# Patient Record
Sex: Male | Born: 1955
Health system: Southern US, Community
[De-identification: ages and names within clinical notes are randomized; demographics above are authoritative.]

## PROBLEM LIST (undated history)

## (undated) DIAGNOSIS — Z860100 Personal history of colon polyps, unspecified: Secondary | ICD-10-CM

## (undated) DIAGNOSIS — E785 Hyperlipidemia, unspecified: Secondary | ICD-10-CM

## (undated) DIAGNOSIS — T7840XA Allergy, unspecified, initial encounter: Secondary | ICD-10-CM

## (undated) HISTORY — DX: Hyperlipidemia, unspecified: E78.5

## (undated) HISTORY — DX: Personal history of colonic polyps: Z86.010

## (undated) HISTORY — DX: Allergy, unspecified, initial encounter: T78.40XA

## (undated) HISTORY — DX: Personal history of colon polyps, unspecified: Z86.0100

## (undated) HISTORY — PX: OTHER SURGICAL HISTORY: SHX169

---

## 2004-02-12 HISTORY — PX: COLONOSCOPY: SHX174

## 2004-11-09 ENCOUNTER — Ambulatory Visit (HOSPITAL_COMMUNITY): Admission: RE | Admit: 2004-11-09 | Discharge: 2004-11-09 | Payer: Self-pay | Admitting: Gastroenterology

## 2004-11-09 ENCOUNTER — Encounter (INDEPENDENT_AMBULATORY_CARE_PROVIDER_SITE_OTHER): Payer: Self-pay | Admitting: Specialist

## 2004-11-09 LAB — HM COLONOSCOPY

## 2005-08-12 ENCOUNTER — Ambulatory Visit: Payer: Self-pay | Admitting: Family Medicine

## 2005-11-18 ENCOUNTER — Ambulatory Visit: Payer: Self-pay | Admitting: Family Medicine

## 2006-11-28 ENCOUNTER — Ambulatory Visit: Payer: Self-pay | Admitting: Family Medicine

## 2007-12-02 ENCOUNTER — Ambulatory Visit: Payer: Self-pay | Admitting: Family Medicine

## 2007-12-08 ENCOUNTER — Ambulatory Visit: Payer: Self-pay | Admitting: Family Medicine

## 2008-02-09 ENCOUNTER — Ambulatory Visit: Payer: Self-pay | Admitting: Family Medicine

## 2008-06-10 ENCOUNTER — Ambulatory Visit: Payer: Self-pay | Admitting: Family Medicine

## 2008-12-23 ENCOUNTER — Ambulatory Visit: Payer: Self-pay | Admitting: Family Medicine

## 2009-03-28 ENCOUNTER — Ambulatory Visit: Payer: Self-pay | Admitting: Family Medicine

## 2009-07-08 ENCOUNTER — Emergency Department: Payer: Self-pay | Admitting: Internal Medicine

## 2009-07-14 ENCOUNTER — Ambulatory Visit: Payer: Self-pay | Admitting: Family Medicine

## 2010-01-01 ENCOUNTER — Ambulatory Visit: Payer: Self-pay | Admitting: Family Medicine

## 2010-08-23 IMAGING — CT CT HEAD WITHOUT CONTRAST
2 series · 16 of 30 positions shown, 20 images · non-contrast
Comparison: none

REASON FOR EXAM: facial numbness on r
COMMENTS:

[Series 2: without · axial · non-contrast · 0.46mm/px · z∈[-191,-51]mm · 13 of 34 slices shown, 17 images]
[im 3/34  brain]
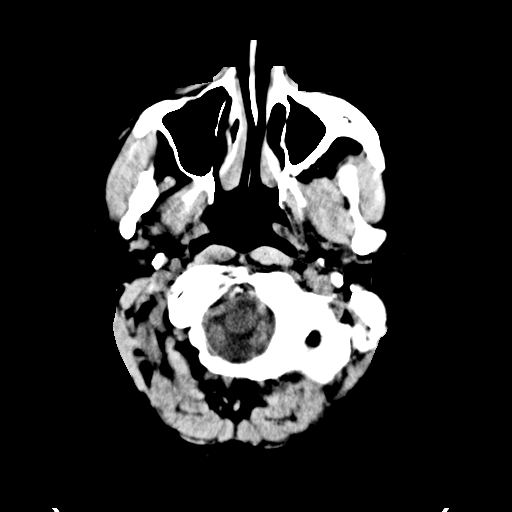
[im 3/34  bone]
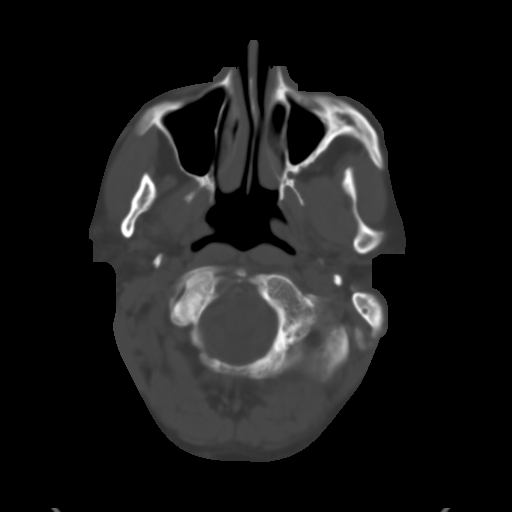
[im 5/34  brain]
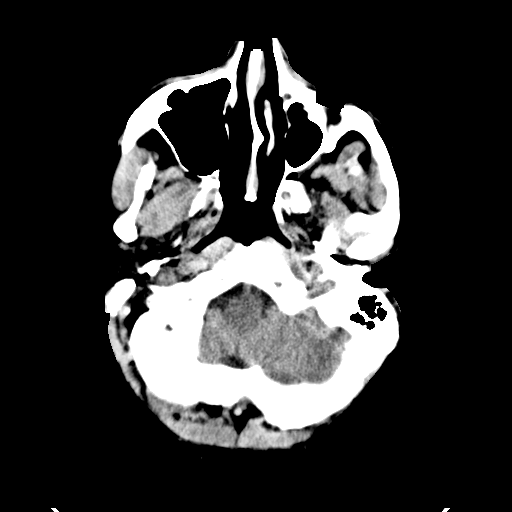
[im 8/34  brain]
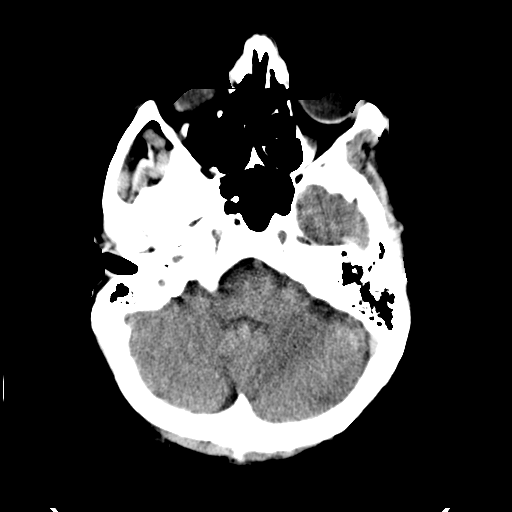
[im 10/34  brain]
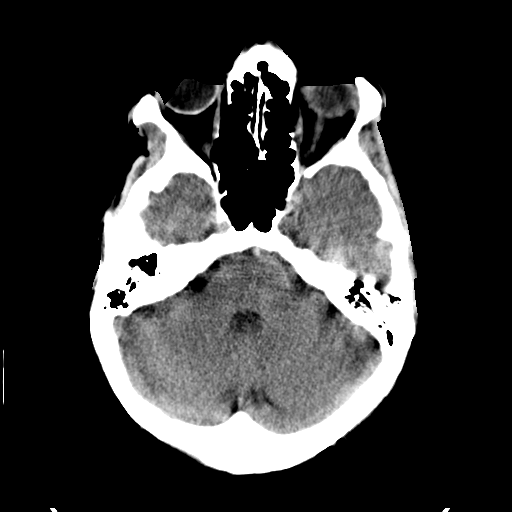
[im 12/34  brain]
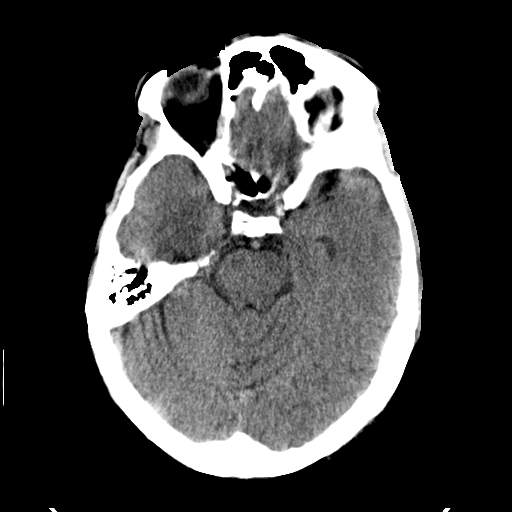
[im 12/34  bone]
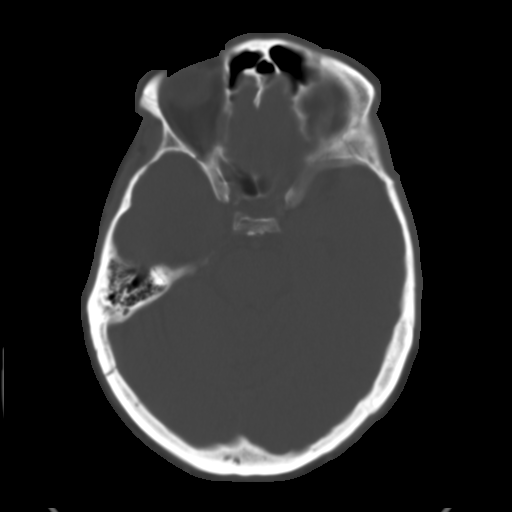
[im 15/34  brain]
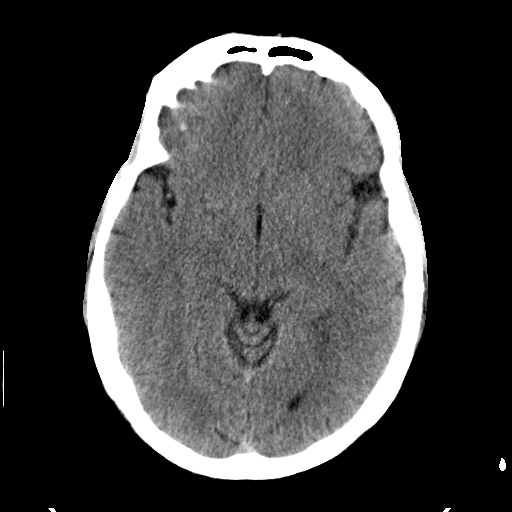
[im 17/34  brain]
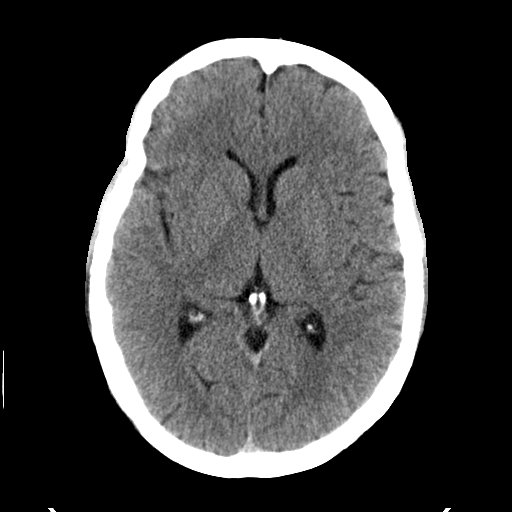
[im 19/34  brain]
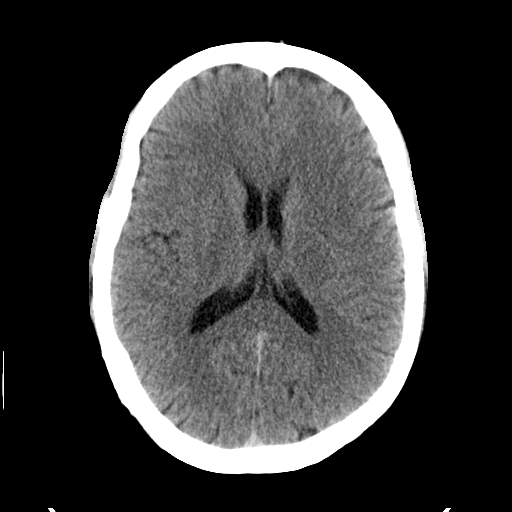
[im 22/34  brain]
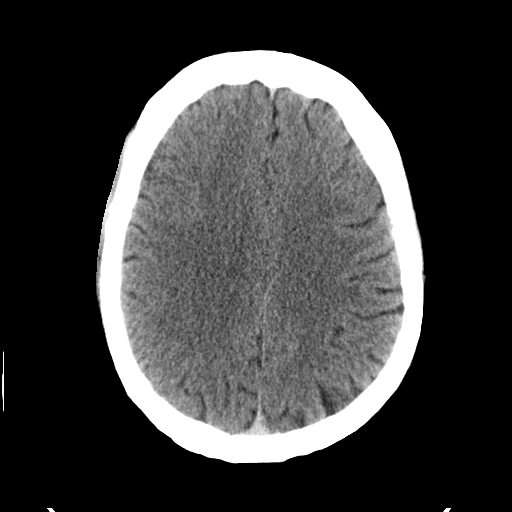
[im 22/34  bone]
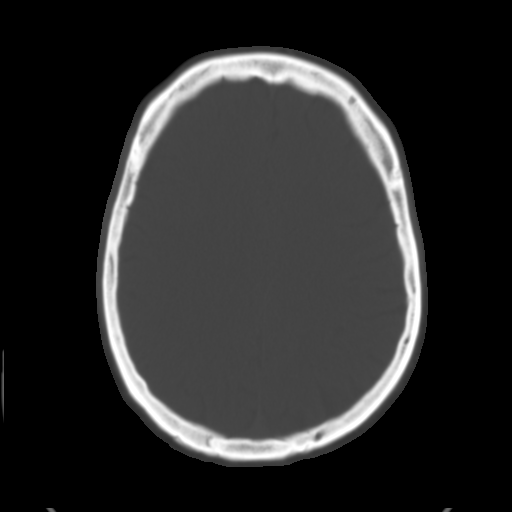
[im 24/34  brain]
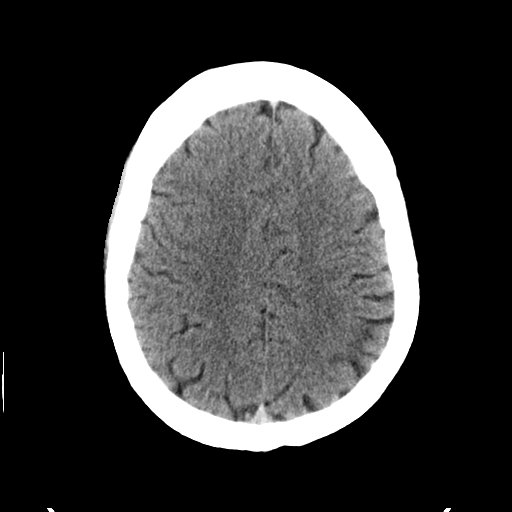
[im 26/34  brain]
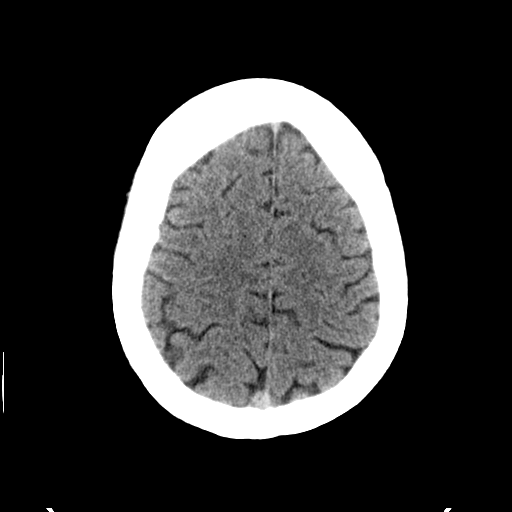
[im 29/34  brain]
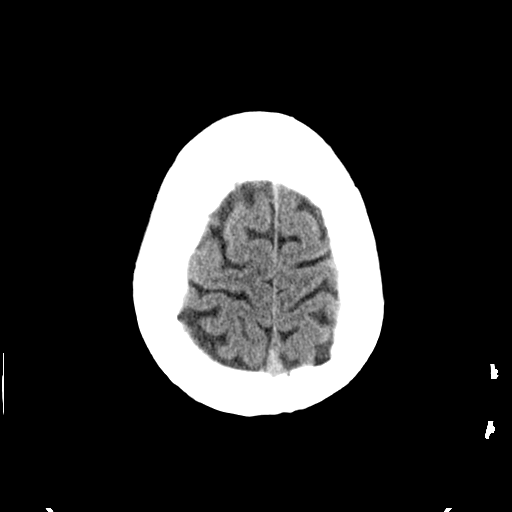
[im 31/34  brain]
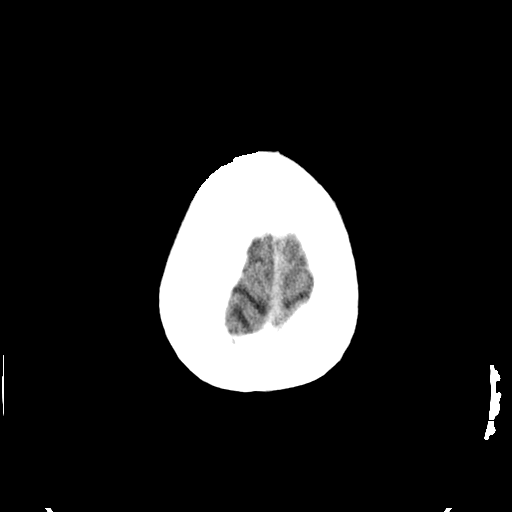
[im 31/34  bone]
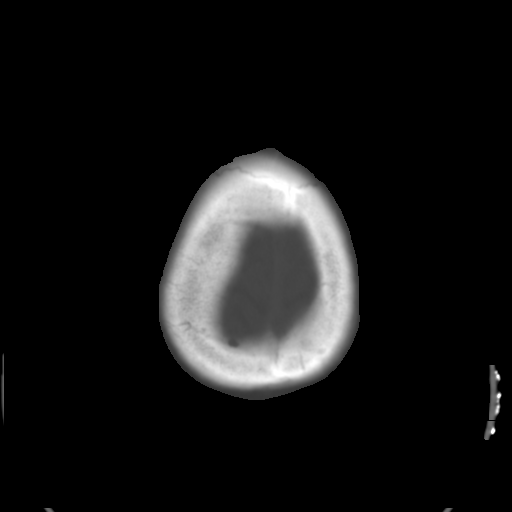

[Series 3: bone · axial · 0.46mm/px · z∈[-191,-146]mm · 3 of 34 slices shown]
[im 3/34  bone]
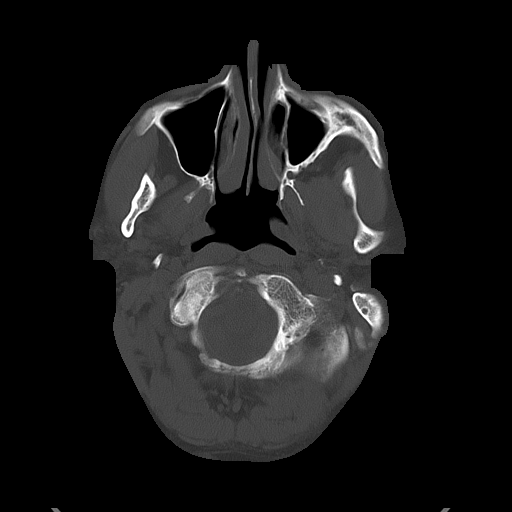
[im 8/34  bone]
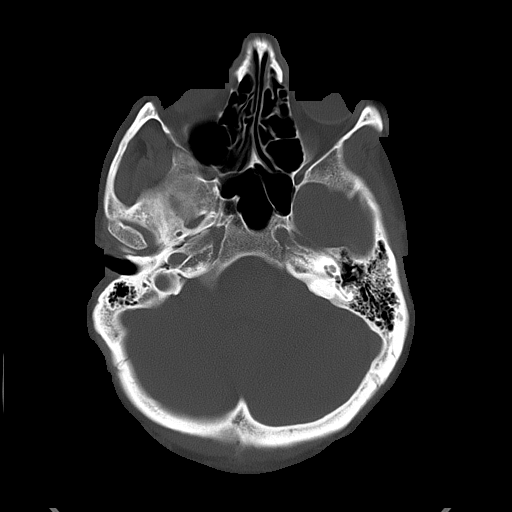
[im 12/34  bone]
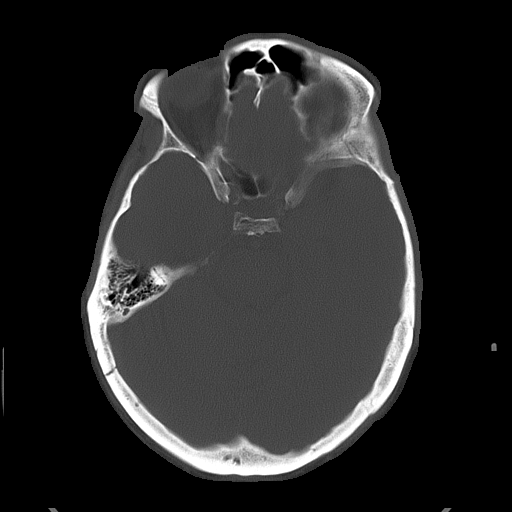

[16 of 30 positions shown; findings below may reference images not displayed]

PROCEDURE:     CT  - CT HEAD WITHOUT CONTRAST  - July 08, 2009  [DATE]

RESULT:     Noncontrast emergent CT of the brain is performed in the
standard fashion. The patient has no previous exam for comparison.

The ventricles and sulci are normal. There is no hemorrhage. There is no
focal mass, mass-effect or midline shift. There is no evidence of edema or
territorial infarct. The bone windows demonstrate normal aeration of the
paranasal sinuses and mastoid air cells. There is no skull fracture
demonstrated.
IMPRESSION: 1. No acute intracranial abnormality.

## 2011-01-07 ENCOUNTER — Encounter: Payer: Self-pay | Admitting: Family Medicine

## 2011-01-08 ENCOUNTER — Ambulatory Visit (INDEPENDENT_AMBULATORY_CARE_PROVIDER_SITE_OTHER): Payer: BC Managed Care – PPO | Admitting: Family Medicine

## 2011-01-08 ENCOUNTER — Encounter: Payer: Self-pay | Admitting: Family Medicine

## 2011-01-08 VITALS — BP 124/80 | HR 60 | Wt 204.0 lb

## 2011-01-08 DIAGNOSIS — J301 Allergic rhinitis due to pollen: Secondary | ICD-10-CM | POA: Insufficient documentation

## 2011-01-08 DIAGNOSIS — Z6379 Other stressful life events affecting family and household: Secondary | ICD-10-CM

## 2011-01-08 DIAGNOSIS — Z0000 Encounter for general adult medical examination without abnormal findings: Secondary | ICD-10-CM

## 2011-01-08 DIAGNOSIS — Z209 Contact with and (suspected) exposure to unspecified communicable disease: Secondary | ICD-10-CM

## 2011-01-08 DIAGNOSIS — E785 Hyperlipidemia, unspecified: Secondary | ICD-10-CM | POA: Insufficient documentation

## 2011-01-08 DIAGNOSIS — Z23 Encounter for immunization: Secondary | ICD-10-CM

## 2011-01-08 LAB — POCT URINALYSIS DIPSTICK
Bilirubin, UA: NEGATIVE
Blood, UA: NEGATIVE
Glucose, UA: NEGATIVE
Ketones, UA: NEGATIVE
Leukocytes, UA: NEGATIVE
Nitrite, UA: NEGATIVE
Protein, UA: NEGATIVE
Spec Grav, UA: 1.02
Urobilinogen, UA: NEGATIVE
pH, UA: 5

## 2011-01-08 LAB — LIPID PANEL
Cholesterol: 224 mg/dL — ABNORMAL HIGH (ref 0–200)
HDL: 35 mg/dL — ABNORMAL LOW (ref >39)
LDL Cholesterol: 160 mg/dL — ABNORMAL HIGH (ref 0–99)
Total CHOL/HDL Ratio: 6.4 Ratio
Triglycerides: 147 mg/dL (ref <150)
VLDL: 29 mg/dL (ref 0–40)

## 2011-01-08 LAB — CBC WITH DIFFERENTIAL/PLATELET
Basophils Absolute: 0.1 10*3/uL (ref 0.0–0.1)
Basophils Relative: 1 % (ref 0–1)
Eosinophils Absolute: 0 10*3/uL (ref 0.0–0.7)
Eosinophils Relative: 1 % (ref 0–5)
HCT: 45.4 % (ref 39.0–52.0)
Hemoglobin: 15.4 g/dL (ref 13.0–17.0)
Lymphocytes Relative: 23 % (ref 12–46)
Lymphs Abs: 1.9 10*3/uL (ref 0.7–4.0)
MCH: 30.3 pg (ref 26.0–34.0)
MCHC: 33.9 g/dL (ref 30.0–36.0)
MCV: 89.2 fL (ref 78.0–100.0)
Monocytes Absolute: 0.5 10*3/uL (ref 0.1–1.0)
Monocytes Relative: 6 % (ref 3–12)
Neutro Abs: 5.9 10*3/uL (ref 1.7–7.7)
Neutrophils Relative %: 70 % (ref 43–77)
Platelets: 256 10*3/uL (ref 150–400)
RBC: 5.09 MIL/uL (ref 4.22–5.81)
RDW: 13.1 % (ref 11.5–15.5)
WBC: 8.4 10*3/uL (ref 4.0–10.5)

## 2011-01-08 LAB — COMPREHENSIVE METABOLIC PANEL
ALT: 31 U/L (ref 0–53)
AST: 28 U/L (ref 0–37)
Albumin: 4.8 g/dL (ref 3.5–5.2)
Alkaline Phosphatase: 70 U/L (ref 39–117)
BUN: 24 mg/dL — ABNORMAL HIGH (ref 6–23)
CO2: 25 mEq/L (ref 19–32)
Calcium: 9.8 mg/dL (ref 8.4–10.5)
Chloride: 104 mEq/L (ref 96–112)
Creat: 1.12 mg/dL (ref 0.50–1.35)
Glucose, Bld: 96 mg/dL (ref 70–99)
Potassium: 4.2 mEq/L (ref 3.5–5.3)
Sodium: 141 mEq/L (ref 135–145)
Total Bilirubin: 1.1 mg/dL (ref 0.3–1.2)
Total Protein: 7.2 g/dL (ref 6.0–8.3)

## 2011-01-08 LAB — POC HEMOCCULT BLD/STL (OFFICE/1-CARD/DIAGNOSTIC): Fecal Occult Blood, POC: NEGATIVE

## 2011-01-08 MED ORDER — ALPRAZOLAM 0.25 MG PO TABS: 0.2500 mg | ORAL_TABLET | Freq: Three times a day (TID) | ORAL | Status: DC | PRN

## 2011-01-08 NOTE — Progress Notes (Signed)
Subjective:    Patient ID: Curtis Walker, male    DOB: 03-22-1955, 55 y.o.   MRN: [id number]  HPI He is here for complete examination. His allergies seem to be under good control. He does have a history of hyperlipidemia but presently is on no medications. He quit smoking in 1987. He has been under stress recently due to to illness with his father. His father is intubated and there is concerns over when to extubate him. He is sexually active and would like to have some STD screening.   Review of Systems  Constitutional: Negative.   HENT: Negative.   Eyes: Negative.   Respiratory: Negative.   Cardiovascular: Negative.   Gastrointestinal: Negative.   Genitourinary: Negative.   Musculoskeletal: Negative.   Skin: Negative.   Neurological: Negative.   Hematological: Negative.   Psychiatric/Behavioral: Negative.        Objective:   Physical Exam BP 124/80  Pulse 60  Wt 204 lb (92.534 kg)  General Appearance:    Alert, cooperative, no distress, appears stated age  Head:    Normocephalic, without obvious abnormality, atraumatic  Eyes:    PERRL, conjunctiva/corneas clear, EOM's intact, fundi    benign  Ears:    Normal TM's and external ear canals  Nose:   Nares normal, mucosa normal, no drainage or sinus   tenderness  Throat:   Lips, mucosa, and tongue normal; teeth and gums normal  Neck:   Supple, no lymphadenopathy;  thyroid:  no   enlargement/tenderness/nodules; no carotid   bruit or JVD  Back:    Spine nontender, no curvature, ROM normal, no CVA     tenderness  Lungs:     Clear to auscultation bilaterally without wheezes, rales or     ronchi; respirations unlabored  Chest Wall:    No tenderness or deformity   Heart:    Regular rate and rhythm, S1 and S2 normal, no murmur, rub   or gallop  Breast Exam:    No chest wall tenderness, masses or gynecomastia  Abdomen:     Soft, non-tender, nondistended, normoactive bowel sounds,    no masses, no hepatosplenomegaly  Genitalia:     Normal male external genitalia without lesions.  Testicles without masses.  No inguinal hernias.  Rectal:    Normal sphincter tone, no masses or tenderness; guaiac negative stool.  Prostate smooth, no nodules, not enlarged.  Extremities:   No clubbing, cyanosis or edema  Pulses:   2+ and symmetric all extremities  Skin:   Skin color, texture, turgor normal, no rashes or lesions  Lymph nodes:   Cervical, supraclavicular, and axillary nodes normal  Neurologic:   CNII-XII intact, normal strength, sensation and gait; reflexes 2+ and symmetric throughout          Psych:   Normal mood, affect, hygiene and grooming.           Assessment & Plan:   1. Physical exam, annual  POCT Urinalysis Dipstick, POCT Hemoccult (POC) Blood/Stool Test, CBC with Differential, Comprehensive metabolic panel, Lipid panel  2. Contact with or exposure to unspecified communicable disease  HIV Antibody ( Reflex), RPR  3. Stressful life event affecting family    4. Allergic rhinitis due to pollen    5. Hyperlipidemia LDL goal <100     we discussed stress and stress management with him especially in regard to his father and extubating him. He will discuss this with his stepmother. Xanax prescription was written. His immunizations were also updated.

## 2011-01-08 NOTE — Patient Instructions (Signed)
Uses Xanax as needed for anxiety in dealing with your father

## 2011-01-09 LAB — RPR

## 2011-01-09 LAB — HIV ANTIBODY (ROUTINE TESTING W REFLEX): HIV: NONREACTIVE

## 2011-05-27 ENCOUNTER — Ambulatory Visit (INDEPENDENT_AMBULATORY_CARE_PROVIDER_SITE_OTHER): Payer: BC Managed Care – PPO | Admitting: Family Medicine

## 2011-05-27 ENCOUNTER — Encounter: Payer: Self-pay | Admitting: Family Medicine

## 2011-05-27 VITALS — BP 110/60 | HR 78 | Temp 98.1°F | Resp 16 | Wt 206.0 lb

## 2011-05-27 DIAGNOSIS — K648 Other hemorrhoids: Secondary | ICD-10-CM

## 2011-05-27 MED ORDER — HYDROCORTISONE ACE-PRAMOXINE 1-1 % RE FOAM: 1.0000 | Freq: Two times a day (BID) | RECTAL | Status: AC

## 2011-05-27 NOTE — Patient Instructions (Signed)
Medicine for about a week and also assure you have softer bowel movements. If you have more difficulty call me

## 2011-05-27 NOTE — Progress Notes (Signed)
  Subjective:    Patient ID: Curtis Walker, male    DOB: Aug 08, 1955, 56 y.o.   MRN: [id number]  HPI He notes a three-week history of intermittent difficulty with rectal bleeding. This is especially to when he has to strain to have a BM. He is normally fairly regular. He has no pain with this. He does note tissue protruding from the anus.   Review of Systems     Objective:   Physical Exam Anal exam shows no external hemorrhoids or redundant tissue. Digital rectal exam showed no palpable lesions.       Assessment & Plan:   1. Internal hemorrhoids  hydrocortisone-pramoxine (PROCTOFOAM HC) rectal foam   Also recommend he continue to have softer BMs. If continued difficulty, he will call me for probable referral for further therapy for the internal hemorrhoids.

## 2011-10-16 ENCOUNTER — Encounter: Payer: Self-pay | Admitting: Medical

## 2011-10-16 ENCOUNTER — Ambulatory Visit (INDEPENDENT_AMBULATORY_CARE_PROVIDER_SITE_OTHER): Payer: BC Managed Care – PPO | Admitting: Medical

## 2011-10-16 VITALS — BP 110/70 | HR 78 | Temp 98.3°F | Resp 16 | Wt 200.0 lb

## 2011-10-16 DIAGNOSIS — A088 Other specified intestinal infections: Secondary | ICD-10-CM

## 2011-10-16 DIAGNOSIS — A084 Viral intestinal infection, unspecified: Secondary | ICD-10-CM

## 2011-10-16 DIAGNOSIS — R197 Diarrhea, unspecified: Secondary | ICD-10-CM

## 2011-10-16 NOTE — Patient Instructions (Signed)
Viral Gastroenteritis Viral gastroenteritis is also known as stomach flu. This condition affects the stomach and intestinal tract. It can cause sudden diarrhea and vomiting. The illness typically lasts 3 to 8 days. Most people develop an immune response that eventually gets rid of the virus. While this natural response develops, the virus can make you quite ill. CAUSES  Many different viruses can cause gastroenteritis, such as rotavirus or noroviruses. You can catch one of these viruses by consuming contaminated food or water. You may also catch a virus by sharing utensils or other personal items with an infected person or by touching a contaminated surface. SYMPTOMS  The most common symptoms are diarrhea and vomiting. These problems can cause a severe loss of body fluids (dehydration) and a body salt (electrolyte) imbalance. Other symptoms may include:  Fever.   Headache.   Fatigue.   Abdominal pain.  DIAGNOSIS  Your caregiver can usually diagnose viral gastroenteritis based on your symptoms and a physical exam. A stool sample may also be taken to test for the presence of viruses or other infections. TREATMENT  This illness typically goes away on its own. Treatments are aimed at rehydration. The most serious cases of viral gastroenteritis involve vomiting so severely that you are not able to keep fluids down. In these cases, fluids must be given through an intravenous line (IV). HOME CARE INSTRUCTIONS   Drink enough fluids to keep your urine clear or pale yellow. Drink small amounts of fluids frequently and increase the amounts as tolerated.   Ask your caregiver for specific rehydration instructions.   Avoid:   Foods high in sugar.   Alcohol.   Carbonated drinks.   Tobacco.   Juice.   Caffeine drinks.   Extremely hot or cold fluids.   Fatty, greasy foods.   Too much intake of anything at one time.   Dairy products until 24 to 48 hours after diarrhea stops.   You may  consume probiotics. Probiotics are active cultures of beneficial bacteria. They may lessen the amount and number of diarrheal stools in adults. Probiotics can be found in yogurt with active cultures and in supplements.   Wash your hands well to avoid spreading the virus.   Only take over-the-counter or prescription medicines for pain, discomfort, or fever as directed by your caregiver. Do not give aspirin to children. Antidiarrheal medicines are not recommended.   Ask your caregiver if you should continue to take your regular prescribed and over-the-counter medicines.   Keep all follow-up appointments as directed by your caregiver.  SEEK IMMEDIATE MEDICAL CARE IF:   You are unable to keep fluids down.   You do not urinate at least once every 6 to 8 hours.   You develop shortness of breath.   You notice blood in your stool or vomit. This may look like coffee grounds.   You have abdominal pain that increases or is concentrated in one small area (localized).   You have persistent vomiting or diarrhea.   You have a fever.   The patient is a child younger than 3 months, and he or she has a fever.   The patient is a child older than 3 months, and he or she has a fever and persistent symptoms.   The patient is a child older than 3 months, and he or she has a fever and symptoms suddenly get worse.   The patient is a baby, and he or she has no tears when crying.  MAKE SURE YOU:     Understand these instructions.   Will watch your condition.   Will get help right away if you are not doing well or get worse.  Document Released: 01/28/2005 Document Revised: 01/17/2011 Document Reviewed: 11/14/2010 [employer] Patient Information 2012 [employer]. 

## 2011-10-16 NOTE — Progress Notes (Signed)
Subjective:   HPI  Curtis Walker is a 56 y.o. male who presents for diarrhea since Sunday.   Been having multiple episodes of diarrhea since Sunday 4 days ago.  Had 1-2 episodes of diarrhea Sunday, but the last few days at least 10 episodes of diarrhea.  Very watery diarrhea.  Diarrhea has not been bloody, but he does have hx/o internal hemorrhoid that bleeds intermittent, and it did bleed yesterday.   Not very foul odor compared to normal stool.  He traveled to [place] the week prior, but no recent international travel.  He denies eating undercooked meat or meat left out all day.  He did visit mother in skilled nursing facility over the weekend.   Denies any animal contact other than his dog that sleeps with him.   Feels a little flushed, subjective fever.  Denies nausea, vomiting.  Denies abdominal pain, but is sore from recent abdominal exercises.   No sick contacts with similar.  No recent antibiotic use.  Using Imodium  OTC 4 daily.  Eating yogurt, drinking lots of clear fluids and buttermilk.  No other aggravating or relieving factors.    No other c/o.  The following portions of the patient's history were reviewed and updated as appropriate: allergies, current medications, past family history, past medical history, past social history, past surgical history and problem list.  Past Medical History  Diagnosis Date  . Allergy     RHINITIS  . Dyslipidemia   . History of colonic polyps   . Substance abuse     FORMER SMOKER    Allergies  Allergen Reactions  . Codeine     Review of Systems ROS reviewed and was negative other than noted in HPI or above.    Objective:   Physical Exam  General appearance: alert, no distress, WD/WN Sclera anicteric, no pallor Oral cavity: MMM, no lesions Neck: supple, no lymphadenopathy, no thyromegaly, no masses Heart: RRR, normal S1, S2, no murmurs Lungs: CTA bilaterally, no wheezes, rhonchi, or rales Abdomen: +somehwat increased bs, soft, non  tender, non distended, no masses, no hepatomegaly, no splenomegaly Pulses: 2+ symmetric  Assessment and Plan :     Encounter Diagnoses  Name Primary?  . Viral gastroenteritis Yes  . Diarrhea    Discussed diagnosis, symptoms, treatment.  symptoms suggest viral gastroenteritis.  Advised rest, push clear fluids, BRAT diet, limit Imodium, and if not improving in the next 48 hours, call back.

## 2011-10-18 ENCOUNTER — Telehealth: Payer: Self-pay | Admitting: Medical

## 2011-10-18 NOTE — Telephone Encounter (Signed)
Pt called and said that he has been out of work all week due to the stomach bug he saw you for on Wednesday. He states he would like a out of work note from Tuesday thru the end of the week including today (friday). Please inform if this is ok and front office will handle.

## 2011-10-21 NOTE — Telephone Encounter (Signed)
pls write note for out of work due to viral gastroenteritis

## 2011-10-21 NOTE — Telephone Encounter (Signed)
He had some mild symptoms when I saw him.  What was his symptoms later in the week?  Worse diarrhea, nausea, vomiting?    How is he doing today?

## 2011-10-21 NOTE — Telephone Encounter (Signed)
Patient states he is some what better as far as the diarrhea. He states that the nausa and the vomiting is better. He didn't have any problems over the weekend. He said that he stayed out of work last week from Tuesday until Friday and he would like a work note for those days. CLS

## 2011-10-22 ENCOUNTER — Encounter: Payer: Self-pay | Admitting: Medical

## 2011-10-22 NOTE — Telephone Encounter (Signed)
FAXED OUT OF WORK LETTER TO PT PER HIS REQUEST FAX # [phone]

## 2012-01-17 ENCOUNTER — Ambulatory Visit (INDEPENDENT_AMBULATORY_CARE_PROVIDER_SITE_OTHER): Payer: BC Managed Care – PPO | Admitting: Family Medicine

## 2012-01-17 ENCOUNTER — Encounter: Payer: Self-pay | Admitting: Family Medicine

## 2012-01-17 VITALS — BP 120/76 | HR 80 | Ht 75.5 in | Wt 204.0 lb

## 2012-01-17 DIAGNOSIS — Z209 Contact with and (suspected) exposure to unspecified communicable disease: Secondary | ICD-10-CM

## 2012-01-17 DIAGNOSIS — Z0000 Encounter for general adult medical examination without abnormal findings: Secondary | ICD-10-CM

## 2012-01-17 DIAGNOSIS — Z125 Encounter for screening for malignant neoplasm of prostate: Secondary | ICD-10-CM

## 2012-01-17 DIAGNOSIS — E785 Hyperlipidemia, unspecified: Secondary | ICD-10-CM

## 2012-01-17 DIAGNOSIS — R361 Hematospermia: Secondary | ICD-10-CM

## 2012-01-17 DIAGNOSIS — J301 Allergic rhinitis due to pollen: Secondary | ICD-10-CM

## 2012-01-17 DIAGNOSIS — Z23 Encounter for immunization: Secondary | ICD-10-CM

## 2012-01-17 LAB — CBC WITH DIFFERENTIAL/PLATELET
Basophils Absolute: 0.1 10*3/uL (ref 0.0–0.1)
Basophils Relative: 1 % (ref 0–1)
Eosinophils Absolute: 0.2 10*3/uL (ref 0.0–0.7)
Eosinophils Relative: 2 % (ref 0–5)
HCT: 42.7 % (ref 39.0–52.0)
Hemoglobin: 14.9 g/dL (ref 13.0–17.0)
Lymphocytes Relative: 21 % (ref 12–46)
Lymphs Abs: 1.9 10*3/uL (ref 0.7–4.0)
MCH: 30.7 pg (ref 26.0–34.0)
MCHC: 34.9 g/dL (ref 30.0–36.0)
MCV: 88 fL (ref 78.0–100.0)
Monocytes Absolute: 0.6 10*3/uL (ref 0.1–1.0)
Monocytes Relative: 7 % (ref 3–12)
Neutro Abs: 6 10*3/uL (ref 1.7–7.7)
Neutrophils Relative %: 69 % (ref 43–77)
Platelets: 240 10*3/uL (ref 150–400)
RBC: 4.85 MIL/uL (ref 4.22–5.81)
RDW: 13.9 % (ref 11.5–15.5)
WBC: 8.7 10*3/uL (ref 4.0–10.5)

## 2012-01-17 LAB — COMPREHENSIVE METABOLIC PANEL
ALT: 30 U/L (ref 0–53)
AST: 26 U/L (ref 0–37)
Albumin: 4.7 g/dL (ref 3.5–5.2)
Alkaline Phosphatase: 76 U/L (ref 39–117)
BUN: 24 mg/dL — ABNORMAL HIGH (ref 6–23)
CO2: 25 mEq/L (ref 19–32)
Calcium: 9.5 mg/dL (ref 8.4–10.5)
Chloride: 102 mEq/L (ref 96–112)
Creat: 1.21 mg/dL (ref 0.50–1.35)
Glucose, Bld: 93 mg/dL (ref 70–99)
Potassium: 4.1 mEq/L (ref 3.5–5.3)
Sodium: 139 mEq/L (ref 135–145)
Total Bilirubin: 0.8 mg/dL (ref 0.3–1.2)
Total Protein: 7.1 g/dL (ref 6.0–8.3)

## 2012-01-17 LAB — LIPID PANEL
Cholesterol: 228 mg/dL — ABNORMAL HIGH (ref 0–200)
HDL: 41 mg/dL (ref >39)
LDL Cholesterol: 166 mg/dL — ABNORMAL HIGH (ref 0–99)
Total CHOL/HDL Ratio: 5.6 Ratio
Triglycerides: 105 mg/dL (ref <150)
VLDL: 21 mg/dL (ref 0–40)

## 2012-01-17 LAB — HEMOCCULT GUIAC POC 1CARD (OFFICE)

## 2012-01-17 LAB — POCT URINALYSIS DIPSTICK
Bilirubin, UA: NEGATIVE
Blood, UA: NEGATIVE
Glucose, UA: NEGATIVE
Ketones, UA: NEGATIVE
Leukocytes, UA: NEGATIVE
Nitrite, UA: NEGATIVE
Protein, UA: NEGATIVE
Spec Grav, UA: <=1.005
Urobilinogen, UA: NEGATIVE
pH, UA: 7

## 2012-01-17 LAB — PSA: PSA: 0.58 ng/mL (ref <=4.00)

## 2012-01-17 NOTE — Progress Notes (Signed)
Subjective:    Patient ID: Curtis Walker, male    DOB: October 02, 1955, 56 y.o.   MRN: [id number]  HPI He is here for complete exam. He does have a five-day history of nasal congestion and sneezing, PND but no fever chills or sore throat. He also complains of approximately 3 weeks ago he did note some blood in his semen. He's had no dysuria, discharge, fever, chills, abdominal or genital pain. He is intermittently sexually active and would like to have STD testing to be safe. His allergies are under good control. He does have a previous history of hyperlipidemia. He quit smoking in 1992. Drinks very little. He has a new job at work which seems to be going well. His home life is stable.   Review of Systems  Constitutional: Negative.   HENT: Negative.   Eyes: Negative.   Respiratory: Negative.   Cardiovascular: Negative.   Gastrointestinal: Negative.   Genitourinary: Negative.   Musculoskeletal: Negative.   Skin: Negative.   Neurological: Negative.   Hematological: Negative.   Psychiatric/Behavioral: Negative.        Objective:   Physical Exam BP 120/76  Pulse 80  Ht 6' 3.5" (1.918 m)  Wt 204 lb (92.534 kg)  BMI 25.16 kg/m2  General Appearance:    Alert, cooperative, no distress, appears stated age  Head:    Normocephalic, without obvious abnormality, atraumatic  Eyes:    PERRL, conjunctiva/corneas clear, EOM's intact, fundi    benign  Ears:    Normal TM's and external ear canals  Nose:   Nares normal, mucosa normal, no drainage or sinus   tenderness  Throat:   Lips, mucosa, and tongue normal; teeth and gums normal  Neck:   Supple, no lymphadenopathy;  thyroid:  no   enlargement/tenderness/nodules; no carotid   bruit or JVD  Back:    Spine nontender, no curvature, ROM normal, no CVA     tenderness  Lungs:     Clear to auscultation bilaterally without wheezes, rales or     ronchi; respirations unlabored  Chest Wall:    No tenderness or deformity   Heart:    Regular rate and  rhythm, S1 and S2 normal, no murmur, rub   or gallop  Breast Exam:    No chest wall tenderness, masses or gynecomastia  Abdomen:     Soft, non-tender, nondistended, normoactive bowel sounds,    no masses, no hepatosplenomegaly  Genitalia:    Normal male external genitalia without lesions.  Testicles without masses.  No inguinal hernias.  Rectal:    Normal sphincter tone, no masses or tenderness; guaiac negative stool.  Prostate smooth, no nodules, not enlarged.  Extremities:   No clubbing, cyanosis or edema  Pulses:   2+ and symmetric all extremities  Skin:   Skin color, texture, turgor normal, no rashes or lesions  Lymph nodes:   Cervical, supraclavicular, and axillary nodes normal  Neurologic:   CNII-XII intact, normal strength, sensation and gait; reflexes 2+ and symmetric throughout          Psych:   Normal mood, affect, hygiene and grooming.           Assessment & Plan:   1. Routine general medical examination at a health care facility  Visual acuity screening, POCT Urinalysis Dipstick, Flu vaccine greater than or equal to 3yo preservative free IM, Lipid panel, CBC with Differential, Comprehensive metabolic panel  2. Allergic rhinitis due to pollen    3. Hyperlipidemia LDL goal <100  Lipid panel  4. Contact with or exposure to unspecified communicable disease  HIV Antibody, RPR  5. Hematospermia  PSA  6. Special screening for malignant neoplasm of prostate  PSA   I reassured him that I did not think the blood in his urine was anything significant but we will follow this. I will check a PSA. Explained that it is very unlikely that this is cancer related. He did get on the Internet and became quite concerned.  Flu shot given with risks and benefits discussed. He is also to call me if his URI symptoms do not clear up by next week.

## 2012-01-17 NOTE — Patient Instructions (Signed)
If your symptoms don't clear up through the weekend, give me a call.

## 2012-01-18 LAB — RPR

## 2012-01-18 LAB — HIV ANTIBODY (ROUTINE TESTING W REFLEX): HIV: NONREACTIVE

## 2012-08-04 ENCOUNTER — Encounter: Payer: Self-pay | Admitting: Medical

## 2012-08-04 ENCOUNTER — Ambulatory Visit (INDEPENDENT_AMBULATORY_CARE_PROVIDER_SITE_OTHER): Payer: BC Managed Care – PPO | Admitting: Medical

## 2012-08-04 VITALS — BP 140/82 | HR 68 | Temp 98.1°F | Resp 16 | Wt 210.0 lb

## 2012-08-04 DIAGNOSIS — M79609 Pain in unspecified limb: Secondary | ICD-10-CM

## 2012-08-04 DIAGNOSIS — M79673 Pain in unspecified foot: Secondary | ICD-10-CM

## 2012-08-04 DIAGNOSIS — M109 Gout, unspecified: Secondary | ICD-10-CM

## 2012-08-04 MED ORDER — INDOMETHACIN 50 MG PO CAPS: 50.0000 mg | ORAL_CAPSULE | Freq: Three times a day (TID) | ORAL | Status: DC

## 2012-08-04 NOTE — Progress Notes (Signed)
Subjective: Here for 1 day hx/o joint pain of right big toe at end of work day.   Hurt to drive home, stopped to get aleve due to the pain.  Has been red, swollen.  Soaked in ice water.  Aleve finally kicked in some.  Slept okay, but it awoke him a few times.  He denies hx/o gout but concerned about this since father had gout.  Denies injury, trauma, no fever, no other joints pains other than bilat heels hurting at times.  Been having some intermittent issues with heels, went to [facility] for arch inserts which has helped.    Past Medical History  Diagnosis Date  . Allergy     RHINITIS  . Dyslipidemia   . History of colonic polyps   . Substance abuse     FORMER SMOKER   ROS as in subjective above  Objective: Gen: wd, wn, nad Skin: slight erythema of MTP of great toe right foot, otherwise skin unremarkable MSK: mild tender right MTP, mildly limited ROM of same joint, otherwise feet nontender, no other obvious deformity, rest of feet nontender, normal ROM neurovascularly intact feet/LE  Assessment: Encounter Diagnoses  Name Primary?  . Gout Yes  . Heel pain, unspecified laterality    Plan: Gout - first episode clinically.  discussed diagnosis, treatment, preventative measures.  Begin indomethacin short term, rest the foot, use ice, elevation.  If this recurs, return to check uric acid and other eval/management.  Heel pain - likely mild plantar fascitis.  C/t arch supports, avoid going barefooted, use morning stretches and massage to the feet as discussed.  Recheck in 2-3 wk if not improving.

## 2012-08-04 NOTE — Patient Instructions (Addendum)
For acute gout flare, begin indomethacin, 1 tablet up to 3 times daily for 5-7 days or as short a time as needed. Take this with food/water.   Rest the foot, stay off the foot during flare up.    In general drink plenty of water.  Avoid lots of meat, soda, alcohol.    If this happens again, return and lets check a uric acid level.   Gout Gout is an inflammatory condition (arthritis) caused by a buildup of uric acid crystals in the joints. Uric acid is a chemical that is normally present in the blood. Under some circumstances, uric acid can form into crystals in your joints. This causes joint redness, soreness, and swelling (inflammation). Repeat attacks are common. Over time, uric acid crystals can form into masses (tophi) near a joint, causing disfigurement. Gout is treatable and often preventable. CAUSES  The disease begins with elevated levels of uric acid in the blood. Uric acid is produced by your body when it breaks down a naturally found substance called purines. This also happens when you eat certain foods such as meats and fish. Causes of an elevated uric acid level include:  Being passed down from parent to child (heredity).  Diseases that cause increased uric acid production (obesity, psoriasis, some cancers).  Excessive alcohol use.  Diet, especially diets rich in meat and seafood.  Medicines, including certain cancer-fighting drugs (chemotherapy), diuretics, and aspirin.  Chronic kidney disease. The kidneys are no longer able to remove uric acid well.  Problems with metabolism. Conditions strongly associated with gout include:  Obesity.  High blood pressure.  High cholesterol.  Diabetes. Not everyone with elevated uric acid levels gets gout. It is not understood why some people get gout and others do not. Surgery, joint injury, and eating too much of certain foods are some of the factors that can lead to gout. SYMPTOMS   An attack of gout comes on quickly. It causes  intense pain with redness, swelling, and warmth in a joint.  Fever can occur.  Often, only one joint is involved. Certain joints are more commonly involved:  Base of the big toe.  Knee.  Ankle.  Wrist.  Finger. Without treatment, an attack usually goes away in a few days to weeks. Between attacks, you usually will not have symptoms, which is different from many other forms of arthritis. DIAGNOSIS  Your caregiver will suspect gout based on your symptoms and exam. Removal of fluid from the joint (arthrocentesis) is done to check for uric acid crystals. Your caregiver will give you a medicine that numbs the area (local anesthetic) and use a needle to remove joint fluid for exam. Gout is confirmed when uric acid crystals are seen in joint fluid, using a special microscope. Sometimes, blood, urine, and X-ray tests are also used. TREATMENT  There are 2 phases to gout treatment: treating the sudden onset (acute) attack and preventing attacks (prophylaxis). Treatment of an Acute Attack  Medicines are used. These include anti-inflammatory medicines or steroid medicines.  An injection of steroid medicine into the affected joint is sometimes necessary.  The painful joint is rested. Movement can worsen the arthritis.  You may use warm or cold treatments on painful joints, depending which works best for you.  Discuss the use of coffee, vitamin C, or cherries with your caregiver. These may be helpful treatment options. Treatment to Prevent Attacks After the acute attack subsides, your caregiver may advise prophylactic medicine. These medicines either help your kidneys eliminate uric acid  from your body or decrease your uric acid production. You may need to stay on these medicines for a very long time. The early phase of treatment with prophylactic medicine can be associated with an increase in acute gout attacks. For this reason, during the first few months of treatment, your caregiver may also  advise you to take medicines usually used for acute gout treatment. Be sure you understand your caregiver's directions. You should also discuss dietary treatment with your caregiver. Certain foods such as meats and fish can increase uric acid levels. Other foods such as dairy can decrease levels. Your caregiver can give you a list of foods to avoid. HOME CARE INSTRUCTIONS   Do not take aspirin to relieve pain. This raises uric acid levels.  Only take over-the-counter or prescription medicines for pain, discomfort, or fever as directed by your caregiver.  Rest the joint as much as possible. When in bed, keep sheets and blankets off painful areas.  Keep the affected joint raised (elevated).  Use crutches if the painful joint is in your leg.  Drink enough water and fluids to keep your urine clear or pale yellow. This helps your body get rid of uric acid. Do not drink alcoholic beverages. They slow the passage of uric acid.  Follow your caregiver's dietary instructions. Pay careful attention to the amount of protein you eat. Your daily diet should emphasize fruits, vegetables, whole grains, and fat-free or low-fat milk products.  Maintain a healthy body weight. SEEK MEDICAL CARE IF:   You have an oral temperature above 102 F (38.9 C).  You develop diarrhea, vomiting, or any side effects from medicines.  You do not feel better in 24 hours, or you are getting worse. SEEK IMMEDIATE MEDICAL CARE IF:   Your joint becomes suddenly more tender and you have:  Chills.  An oral temperature above 102 F (38.9 C), not controlled by medicine. MAKE SURE YOU:   Understand these instructions.  Will watch your condition.  Will get help right away if you are not doing well or get worse. Document Released: 01/26/2000 Document Revised: 04/22/2011 Document Reviewed: 05/08/2009 [facility] Patient Information 2014 [place], [place].

## 2012-09-15 ENCOUNTER — Ambulatory Visit (INDEPENDENT_AMBULATORY_CARE_PROVIDER_SITE_OTHER): Payer: BC Managed Care – PPO | Admitting: Family Medicine

## 2012-09-15 ENCOUNTER — Encounter: Payer: Self-pay | Admitting: Family Medicine

## 2012-09-15 VITALS — BP 130/78 | HR 84 | Temp 98.1°F | Resp 16 | Wt 208.0 lb

## 2012-09-15 DIAGNOSIS — M722 Plantar fascial fibromatosis: Secondary | ICD-10-CM

## 2012-09-15 NOTE — Patient Instructions (Signed)
Try heel cups but also get the splints for nighttime use. Keep doing the stretching and kneeding. This for the next month or 2 and as long as her protein keep doing that but if it doesn't help e know

## 2012-09-15 NOTE — Progress Notes (Signed)
  Subjective:    Patient ID: Curtis Walker, male    DOB: [birth year], 58 y.o.   MRN: [id number]  HPI Is here for evaluation of bilateral heel pain, left greater than right. This started about a year ago when he changed jobs requiring him to stand for long periods of time. It has been slowly getting worse. He did get arch supports about a year ago which helped. He bought them comfort shoes about a month ago again helped a little. He has been doing stretching and use Aleve one twice a day all of which has helped.  Review of Systems     Objective:   Physical Exam Exam of both feet does show tenderness to palpation over the calcaneal spur. Pulses and sensation normal. Normal ankle and foot motion.       Assessment & Plan:  Plantar fasciitis, bilateral  recommend he continue with all the modalities that he is so far instituted. Reinforced stretching, anti-inflammatories, arch supports and possibly trying heel cups. Also recommended the use night splinting that he can get through the Internet. Recommend he keep in touch with me in one or 2 months.

## 2012-11-25 ENCOUNTER — Ambulatory Visit (INDEPENDENT_AMBULATORY_CARE_PROVIDER_SITE_OTHER): Payer: BC Managed Care – PPO | Admitting: Family Medicine

## 2012-11-25 VITALS — BP 110/70 | HR 82 | Temp 98.7°F | Wt 210.0 lb

## 2012-11-25 DIAGNOSIS — J111 Influenza due to unidentified influenza virus with other respiratory manifestations: Secondary | ICD-10-CM

## 2012-11-25 NOTE — Progress Notes (Signed)
  Subjective:    Patient ID: Curtis Walker, male    DOB: [birth year], 57 y.o.   MRN: [id number]  HPI  He has the sudden onset of myalgias, fever, chills and abdominal cramping last night. He is feeling slightly better today.   Review of Systems     Objective:   Physical Exam alert and in no distress. Tympanic membranes and canals are normal. Throat is clear. Tonsils are normal. Neck is supple without adenopathy or thyromegaly. Cardiac exam shows a regular sinus rhythm without murmurs or gallops. Lungs are clear to auscultation. Abdominal exam shows no masses or tenderness with normal bowel sounds        Assessment & Plan:  Flu syndrome  explained that some of his symptoms except for GI workup probably viral. Recommend supportive care. He is comfortable with this approach.

## 2013-01-18 ENCOUNTER — Ambulatory Visit (INDEPENDENT_AMBULATORY_CARE_PROVIDER_SITE_OTHER): Payer: BC Managed Care – PPO | Admitting: Family Medicine

## 2013-01-18 ENCOUNTER — Encounter: Payer: Self-pay | Admitting: Family Medicine

## 2013-01-18 VITALS — BP 124/82 | HR 83 | Ht 75.5 in | Wt 208.0 lb

## 2013-01-18 DIAGNOSIS — Z0000 Encounter for general adult medical examination without abnormal findings: Secondary | ICD-10-CM

## 2013-01-18 DIAGNOSIS — J301 Allergic rhinitis due to pollen: Secondary | ICD-10-CM

## 2013-01-18 DIAGNOSIS — E785 Hyperlipidemia, unspecified: Secondary | ICD-10-CM

## 2013-01-18 DIAGNOSIS — Z23 Encounter for immunization: Secondary | ICD-10-CM

## 2013-01-18 DIAGNOSIS — Z209 Contact with and (suspected) exposure to unspecified communicable disease: Secondary | ICD-10-CM

## 2013-01-18 LAB — LIPID PANEL
Cholesterol: 225 mg/dL — ABNORMAL HIGH (ref 0–200)
HDL: 34 mg/dL — ABNORMAL LOW (ref >39)
LDL Cholesterol: 143 mg/dL — ABNORMAL HIGH (ref 0–99)
Total CHOL/HDL Ratio: 6.6 Ratio
Triglycerides: 241 mg/dL — ABNORMAL HIGH (ref <150)
VLDL: 48 mg/dL — ABNORMAL HIGH (ref 0–40)

## 2013-01-18 LAB — HEMOCCULT GUIAC POC 1CARD (OFFICE)

## 2013-01-18 LAB — POCT URINALYSIS DIPSTICK
Bilirubin, UA: NEGATIVE
Blood, UA: NEGATIVE
Glucose, UA: NEGATIVE
Ketones, UA: NEGATIVE
Leukocytes, UA: NEGATIVE
Nitrite, UA: NEGATIVE
Protein, UA: NEGATIVE
Spec Grav, UA: 1.01
Urobilinogen, UA: NEGATIVE
pH, UA: 5

## 2013-01-18 NOTE — Progress Notes (Signed)
Teaching Physician: Sharlot Gowda, MD Dictated By: Judithann Graves  Subjective:  Curtis Walker is a 57 y.o. male who presents for his annual complete exam. He has no acute concerns today.  He has a history of allergic rhinitis and he is always able to control his congestion and rhinorrhea with Zyrtec. He also has a history of hypercholesterolemia and has tried to be mindful of his consumption of high-fat dairy products, now preferring skim milk. Family and social histories were reviewed. He is in a stable relationship with his partner and he would like to pursue STI testing at this visit.   ROS as in subjective.  Objective: Filed Vitals:   01/18/13 0824  BP: 124/82  Pulse: 83    Physical Exam:  BP 124/82  Pulse 83  Ht 6' 3.5" (1.918 m)  Wt 208 lb (94.348 kg)  BMI 25.65 kg/m2  SpO2 99%  General Appearance:    Alert, cooperative, no distress, appears stated age  Head:    Normocephalic, without obvious abnormality, atraumatic  Eyes:    PERRL, conjunctiva/corneas clear, EOM's intact, fundi    benign  Ears:    Normal TM's and external ear canals  Nose:   Nares normal, mucosa normal, no drainage or sinus   tenderness  Throat:   Lips, mucosa, and tongue normal; teeth and gums normal  Neck:   Supple, no lymphadenopathy;  thyroid:  no   enlargement/tenderness/nodules; no carotid   bruit or JVD  Back:    Spine nontender, no curvature, ROM normal, no CVA     tenderness  Lungs:     Clear to auscultation bilaterally without wheezes, rales or     ronchi; respirations unlabored  Chest Wall:    No tenderness or deformity   Heart:    Regular rate and rhythm, S1 and S2 normal, no murmur, rub   or gallop  Breast Exam:    No chest wall tenderness, masses or gynecomastia  Abdomen:     Soft, non-tender, nondistended, normoactive bowel sounds,    no masses, no hepatosplenomegaly  Rectal:    Normal sphincter tone, no masses or tenderness; guaiac negative stool.  Prostate smooth, no nodules, not  enlarged.  Extremities:   No clubbing, cyanosis or edema  Pulses:   2+ and symmetric all extremities  Skin:   Skin color, texture, turgor normal, no rashes or lesions  Lymph nodes:   Cervical, supraclavicular, and axillary nodes normal  Neurologic:   CNII-XII intact, normal strength, sensation and gait; reflexes 2+ and symmetric throughout          Psych:   Normal mood, affect, hygiene and grooming.     Assessment and Plan: 1. Need for prophylactic vaccination and inoculation against influenza - Flu Vaccine QUAD 36+ mos PF IM (Fluarix)  2. Routine general medical examination at a health care facility - POCT Urinalysis Dipstick  3. Contact with or exposure to unspecified communicable disease Will obtain the following: - HIV antibody - RPR - GC/chlamydia probe amp, urine  4. Hyperlipidemia LDL goal <100 - Will check Lipid panel today  5. Allergic rhinitis due to pollen Not presently symptomatic and able to self-manage with Zyrtec.   Dr. Susann Givens was present for the encounter and agrees with the above assessment and plan.

## 2013-01-19 LAB — GC/CHLAMYDIA PROBE AMP, URINE
Chlamydia, Swab/Urine, PCR: NEGATIVE
GC Probe Amp, Urine: NEGATIVE

## 2013-01-19 LAB — HIV ANTIBODY (ROUTINE TESTING W REFLEX): HIV: NONREACTIVE

## 2013-01-19 LAB — RPR

## 2013-12-28 ENCOUNTER — Encounter: Payer: Self-pay | Admitting: Family Medicine

## 2013-12-28 ENCOUNTER — Ambulatory Visit (INDEPENDENT_AMBULATORY_CARE_PROVIDER_SITE_OTHER): Payer: Managed Care, Other (non HMO) | Admitting: Family Medicine

## 2013-12-28 ENCOUNTER — Telehealth: Payer: Self-pay | Admitting: Family Medicine

## 2013-12-28 VITALS — BP 110/70 | HR 80 | Ht 75.0 in | Wt 210.0 lb

## 2013-12-28 DIAGNOSIS — Z113 Encounter for screening for infections with a predominantly sexual mode of transmission: Secondary | ICD-10-CM

## 2013-12-28 DIAGNOSIS — J301 Allergic rhinitis due to pollen: Secondary | ICD-10-CM

## 2013-12-28 DIAGNOSIS — Z209 Contact with and (suspected) exposure to unspecified communicable disease: Secondary | ICD-10-CM

## 2013-12-28 DIAGNOSIS — Z23 Encounter for immunization: Secondary | ICD-10-CM

## 2013-12-28 DIAGNOSIS — E785 Hyperlipidemia, unspecified: Secondary | ICD-10-CM

## 2013-12-28 DIAGNOSIS — Z0000 Encounter for general adult medical examination without abnormal findings: Secondary | ICD-10-CM

## 2013-12-28 LAB — CBC WITH DIFFERENTIAL/PLATELET
Basophils Absolute: 0.1 10*3/uL (ref 0.0–0.1)
Basophils Relative: 1 % (ref 0–1)
Eosinophils Absolute: 0.1 10*3/uL (ref 0.0–0.7)
Eosinophils Relative: 1 % (ref 0–5)
HCT: 42.7 % (ref 39.0–52.0)
Hemoglobin: 15.2 g/dL (ref 13.0–17.0)
Lymphocytes Relative: 30 % (ref 12–46)
Lymphs Abs: 2.3 10*3/uL (ref 0.7–4.0)
MCH: 30.5 pg (ref 26.0–34.0)
MCHC: 35.6 g/dL (ref 30.0–36.0)
MCV: 85.7 fL (ref 78.0–100.0)
MPV: 9.1 fL — ABNORMAL LOW (ref 9.4–12.4)
Monocytes Absolute: 0.5 10*3/uL (ref 0.1–1.0)
Monocytes Relative: 6 % (ref 3–12)
Neutro Abs: 4.8 10*3/uL (ref 1.7–7.7)
Neutrophils Relative %: 62 % (ref 43–77)
Platelets: 271 10*3/uL (ref 150–400)
RBC: 4.98 MIL/uL (ref 4.22–5.81)
RDW: 13.3 % (ref 11.5–15.5)
WBC: 7.8 10*3/uL (ref 4.0–10.5)

## 2013-12-28 LAB — POCT URINALYSIS DIPSTICK
Bilirubin, UA: NEGATIVE
Blood, UA: NEGATIVE
Glucose, UA: NEGATIVE
Ketones, UA: NEGATIVE
Leukocytes, UA: NEGATIVE
Nitrite, UA: NEGATIVE
Protein, UA: NEGATIVE
Spec Grav, UA: 1.025
Urobilinogen, UA: NEGATIVE
pH, UA: 6

## 2013-12-28 NOTE — Progress Notes (Signed)
Subjective:     Patient ID: Curtis Walker, male   DOB: [birth year], 58 y.o.   MRN: [id number]  HPI   This is a 58 yo male with a history of hyperlipidemia and allergic rhinitis who presents for an annual comprehensive physical exam.  He has not had any acute events or developed any new complaints during the interval since his exam in 01/2013.  He has not received an annual flu vaccination.  Review of Systems  Constitutional: Negative for fever, fatigue and unexpected weight change.  HENT: Negative for congestion, hearing loss and trouble swallowing.   Eyes: Negative for visual disturbance.  Respiratory: Negative for shortness of breath.   Cardiovascular: Negative for chest pain and palpitations.  Gastrointestinal: Negative for abdominal pain, constipation, blood in stool, abdominal distention and anal bleeding.  Genitourinary: Negative for dysuria, urgency, frequency, discharge, penile swelling, scrotal swelling, difficulty urinating, penile pain and testicular pain.  Musculoskeletal: Negative for myalgias and arthralgias.  Neurological: Negative for dizziness, light-headedness, numbness and headaches.  Psychiatric/Behavioral: Negative for sleep disturbance and dysphoric mood. The patient is not nervous/anxious.    Meds: OTC Multivitamin & Chondroitin (brand & formulation varies)  Allergies: reviewed and decided that reported codeine sensitivity is not consistent with allergy  PMH: Per HPI  FH: Reviewed.  Non-contributory  SH: 20 pack year smoking history.  Rare etoh use.  No illicit drug use.  Sexually active with men exclusively.  6 partners within the last 6 months.     Objective:   Physical Exam  Constitutional: He is oriented to person, place, and time. He appears well-developed and well-nourished.  HENT:  Head: Normocephalic and atraumatic.  Right Ear: External ear normal.  Left Ear: External ear normal.  Nose: Nose normal.  Mouth/Throat: Oropharynx is clear and moist.   Eyes: Pupils are equal, round, and reactive to light.  Neck: Normal range of motion. Neck supple. No JVD present. No thyromegaly present.  Cardiovascular: Normal rate, regular rhythm and normal heart sounds.   Pulmonary/Chest: Effort normal and breath sounds normal.  Abdominal: Soft. Bowel sounds are normal. He exhibits no distension.  Lymphadenopathy:    He has no cervical adenopathy.  Neurological: He is alert and oriented to person, place, and time. He has normal reflexes.  Skin: Skin is warm and dry.  Psychiatric: He has a normal mood and affect.   Lipids 01/2013: Cholesterol/TG/HDL/VLDL/LDL: 225/241/34/48/143 RPR, GC, HIV screen 2014 normal CBC/CMP 2013 WNL  Health maintenance: PSA 2013 WNL Colonoscopy 2006 normal     Assessment:    Routine general medical examination at a health care facility - Plan: POCT Urinalysis Dipstick, Lipid Panel, CBC with Differential, Comprehensive metabolic panel  Screen for STD (sexually transmitted disease) - Plan: HIV antibody (with reflex), RPR  Hyperlipidemia - Plan: Lipid Panel  Contact with or exposure to communicable disease  Need for prophylactic vaccination and inoculation against influenza - Plan: Flu Vaccine QUAD 36+ mos PF IM (Fluarix Quad PF)  Need for Zostavax administration - Plan: Varicella-zoster vaccine subcutaneous  Allergic rhinitis due to pollen   This is a 58 year old male without significant past medical history who presents for a chronic health maintenance visit.  His major problems include a history of hyperlipidemia and multiple same sex partners putting him at high risk for HIV/STD transmission.  We will continue to assess these risk factors.  Also he is due for a herpes zoster vaccination and his annual vaccination.  His screening exams are up to date.  Plan:     As per assessment note Discussed PREP with him and at this time he will defer this. He is also to call back in the fall to set up another  colonoscopy

## 2013-12-28 NOTE — Telephone Encounter (Signed)
Pt just thought about the fact that during his CPE today, he did not get a digital prostate exam and usually get this done. Bloodwork was taken so pt not sure if PSA will be ran on that instead of doing a digital this time. Pt wants to know if he needs a digital exam.

## 2013-12-29 LAB — COMPREHENSIVE METABOLIC PANEL
ALT: 32 U/L (ref 0–53)
AST: 25 U/L (ref 0–37)
Albumin: 4.5 g/dL (ref 3.5–5.2)
Alkaline Phosphatase: 78 U/L (ref 39–117)
BUN: 22 mg/dL (ref 6–23)
CO2: 25 mEq/L (ref 19–32)
Calcium: 9.7 mg/dL (ref 8.4–10.5)
Chloride: 104 mEq/L (ref 96–112)
Creat: 1.3 mg/dL (ref 0.50–1.35)
Glucose, Bld: 95 mg/dL (ref 70–99)
Potassium: 4 mEq/L (ref 3.5–5.3)
Sodium: 140 mEq/L (ref 135–145)
Total Bilirubin: 0.8 mg/dL (ref 0.2–1.2)
Total Protein: 7.4 g/dL (ref 6.0–8.3)

## 2013-12-29 LAB — RPR

## 2013-12-29 LAB — LIPID PANEL
Cholesterol: 206 mg/dL — ABNORMAL HIGH (ref 0–200)
HDL: 40 mg/dL (ref >39)
LDL Cholesterol: 131 mg/dL — ABNORMAL HIGH (ref 0–99)
Total CHOL/HDL Ratio: 5.2 Ratio
Triglycerides: 177 mg/dL — ABNORMAL HIGH (ref <150)
VLDL: 35 mg/dL (ref 0–40)

## 2013-12-29 LAB — HIV ANTIBODY (ROUTINE TESTING W REFLEX): HIV 1&2 Ab, 4th Generation: NONREACTIVE

## 2014-01-24 NOTE — Telephone Encounter (Signed)
tsd  °

## 2014-04-05 ENCOUNTER — Encounter: Payer: Self-pay | Admitting: Family Medicine

## 2014-04-05 ENCOUNTER — Ambulatory Visit (INDEPENDENT_AMBULATORY_CARE_PROVIDER_SITE_OTHER): Payer: Managed Care, Other (non HMO) | Admitting: Family Medicine

## 2014-04-05 VITALS — BP 120/70 | HR 82 | Temp 98.2°F | Wt 212.0 lb

## 2014-04-05 DIAGNOSIS — J301 Allergic rhinitis due to pollen: Secondary | ICD-10-CM

## 2014-04-05 DIAGNOSIS — J0100 Acute maxillary sinusitis, unspecified: Secondary | ICD-10-CM

## 2014-04-05 DIAGNOSIS — R197 Diarrhea, unspecified: Secondary | ICD-10-CM

## 2014-04-05 MED ORDER — SULFAMETHOXAZOLE-TRIMETHOPRIM 800-160 MG PO TABS: 1.0000 | ORAL_TABLET | Freq: Two times a day (BID) | ORAL | Status: DC

## 2014-04-05 NOTE — Patient Instructions (Addendum)
Use yogurt or probiotic are both. Take all the antibiotic and if not totally back to normal when you finish your medicine

## 2014-04-05 NOTE — Progress Notes (Signed)
   Subjective:    Patient ID: Curtis Walker, male    DOB: January 29, 1956, 59 y.o.   MRN: [id number]  HPI He complains of a two-week history that started with nasal congestion, rhinorrhea, PND that has become purulent. He now complains of headache but no fever or chills. No upper tooth discomfort. He does not smoke but does have underlying allergies. He slowly he is also had some diarrhea.   Review of Systems     Objective:   Physical Exam Alert and in no distress. Nasal mucosa is slightly red with tenderness especially over left maxillary sinus Tympanic membranes and canals are normal. Pharyngeal area is normal. Neck is supple without adenopathy or thyromegaly. Cardiac exam shows a regular sinus rhythm without murmurs or gallops. Lungs are clear to auscultation.        Assessment & Plan:  Allergic rhinitis due to pollen  Acute maxillary sinusitis, recurrence not specified - Plan: sulfamethoxazole-trimethoprim (BACTRIM DS,SEPTRA DS) 800-160 MG per tablet  Diarrhea  recommend supportive care for the diarrhea. He is to call if not entirely better when he finishes the course of the antibiotic.

## 2014-07-12 ENCOUNTER — Ambulatory Visit (INDEPENDENT_AMBULATORY_CARE_PROVIDER_SITE_OTHER): Payer: Managed Care, Other (non HMO) | Admitting: Family Medicine

## 2014-07-12 ENCOUNTER — Encounter: Payer: Self-pay | Admitting: Family Medicine

## 2014-07-12 VITALS — BP 134/82 | HR 80 | Wt 220.0 lb

## 2014-07-12 DIAGNOSIS — L309 Dermatitis, unspecified: Secondary | ICD-10-CM | POA: Diagnosis not present

## 2014-07-12 NOTE — Patient Instructions (Signed)
UseZyrtec, Claritin or Allegra during the dayand Benadryl at night For the itching and also cortisone cream. Cornstarch for under your

## 2014-07-12 NOTE — Progress Notes (Signed)
   Subjective:    Patient ID: Curtis Walker, male    DOB: 1955/03/23, 58 y.o.   MRN: [id number]  HPI He noted the onset of a rash last Thursday. He blames it on a herbal supplement that he started taking several days before that. He has used cortisone cream on it as well as Benadryl at night. He has had no new soaps, detergents, colognes, new clothes. He states that the rash is only in the axillary and inguinal area.   Review of Systems     Objective:   Physical Exam Exam of both axilla does show diffuse erythema but does not cover the entire axillary area. He also has erythema in the inner upper thigh area. Penis and testes are normal.       Assessment & Plan:  Dermatitis I explained that the nature of this rash did not make me think of a true allergic reaction. It could possibly be an adverse reaction to supplement. Apparently he was mainly a vitamin supplement. Recommend supportive care with continuing to use the cortisone cream as well as anti-histamine especially Benadryl at night. Also discussed using cornstarch in his axillary area to help with perspiration.

## 2014-11-29 LAB — HM COLONOSCOPY

## 2014-12-01 ENCOUNTER — Encounter: Payer: Self-pay | Admitting: Family Medicine

## 2014-12-13 ENCOUNTER — Encounter: Payer: Self-pay | Admitting: Family Medicine

## 2015-01-03 ENCOUNTER — Encounter: Payer: Self-pay | Admitting: Family Medicine

## 2015-01-03 ENCOUNTER — Ambulatory Visit (INDEPENDENT_AMBULATORY_CARE_PROVIDER_SITE_OTHER): Payer: Managed Care, Other (non HMO) | Admitting: Family Medicine

## 2015-01-03 VITALS — BP 120/80 | HR 80 | Ht 75.0 in | Wt 219.0 lb

## 2015-01-03 DIAGNOSIS — E785 Hyperlipidemia, unspecified: Secondary | ICD-10-CM | POA: Diagnosis not present

## 2015-01-03 DIAGNOSIS — Z1159 Encounter for screening for other viral diseases: Secondary | ICD-10-CM

## 2015-01-03 DIAGNOSIS — Z113 Encounter for screening for infections with a predominantly sexual mode of transmission: Secondary | ICD-10-CM

## 2015-01-03 DIAGNOSIS — J301 Allergic rhinitis due to pollen: Secondary | ICD-10-CM

## 2015-01-03 DIAGNOSIS — Z23 Encounter for immunization: Secondary | ICD-10-CM

## 2015-01-03 DIAGNOSIS — Z86010 Personal history of colonic polyps: Secondary | ICD-10-CM

## 2015-01-03 DIAGNOSIS — Z0000 Encounter for general adult medical examination without abnormal findings: Secondary | ICD-10-CM

## 2015-01-03 LAB — CBC WITH DIFFERENTIAL/PLATELET
Basophils Absolute: 0.1 10*3/uL (ref 0.0–0.1)
Basophils Relative: 1 % (ref 0–1)
Eosinophils Absolute: 0.2 10*3/uL (ref 0.0–0.7)
Eosinophils Relative: 2 % (ref 0–5)
HCT: 42.8 % (ref 39.0–52.0)
Hemoglobin: 15.3 g/dL (ref 13.0–17.0)
Lymphocytes Relative: 29 % (ref 12–46)
Lymphs Abs: 2.7 10*3/uL (ref 0.7–4.0)
MCH: 30.7 pg (ref 26.0–34.0)
MCHC: 35.7 g/dL (ref 30.0–36.0)
MCV: 85.9 fL (ref 78.0–100.0)
MPV: 9.3 fL (ref 8.6–12.4)
Monocytes Absolute: 0.6 10*3/uL (ref 0.1–1.0)
Monocytes Relative: 6 % (ref 3–12)
Neutro Abs: 5.8 10*3/uL (ref 1.7–7.7)
Neutrophils Relative %: 62 % (ref 43–77)
Platelets: 258 10*3/uL (ref 150–400)
RBC: 4.98 MIL/uL (ref 4.22–5.81)
RDW: 13.4 % (ref 11.5–15.5)
WBC: 9.4 10*3/uL (ref 4.0–10.5)

## 2015-01-03 LAB — COMPREHENSIVE METABOLIC PANEL
ALT: 70 U/L — ABNORMAL HIGH (ref 9–46)
AST: 44 U/L — ABNORMAL HIGH (ref 10–35)
Albumin: 4.6 g/dL (ref 3.6–5.1)
Alkaline Phosphatase: 85 U/L (ref 40–115)
BUN: 21 mg/dL (ref 7–25)
CO2: 25 mmol/L (ref 20–31)
Calcium: 9.2 mg/dL (ref 8.6–10.3)
Chloride: 104 mmol/L (ref 98–110)
Creat: 1.31 mg/dL — ABNORMAL HIGH (ref 0.70–1.25)
Glucose, Bld: 87 mg/dL (ref 65–99)
Potassium: 3.8 mmol/L (ref 3.5–5.3)
Sodium: 140 mmol/L (ref 135–146)
Total Bilirubin: 0.8 mg/dL (ref 0.2–1.2)
Total Protein: 7.2 g/dL (ref 6.1–8.1)

## 2015-01-03 LAB — LIPID PANEL
Cholesterol: 235 mg/dL — ABNORMAL HIGH (ref 125–200)
HDL: 34 mg/dL — ABNORMAL LOW (ref >=40)
LDL Cholesterol: 159 mg/dL — ABNORMAL HIGH (ref <130)
Total CHOL/HDL Ratio: 6.9 Ratio — ABNORMAL HIGH (ref <=5.0)
Triglycerides: 209 mg/dL — ABNORMAL HIGH (ref <150)
VLDL: 42 mg/dL — ABNORMAL HIGH (ref <30)

## 2015-01-03 NOTE — Progress Notes (Signed)
   Subjective:    Patient ID: Curtis Walker, male    DOB: 1955-06-06, 59 y.o.   MRN: [id number]  HPI He is here for complete examination. He has no particular concerns or complaints. His allergies are under good control. He does have a history of hyperlipidemia. He recently had a colonoscopy; he does have a history of colonic polyps and apparently 3 were removed.. Family and social history as well as health maintenance and immunizations were reviewed. He is in a long-term relationship but he sometimes does have sexual activity outside of that. Work is going well. He's had no chest pain, shortness of breath, GI complaints   Review of Systems  All other systems reviewed and are negative.      Objective:   Physical Exam BP 120/80 mmHg  Pulse 80  Ht 6\' 3"  (1.905 m)  Wt 219 lb (99.338 kg)  BMI 27.37 kg/m2  General Appearance:    Alert, cooperative, no distress, appears stated age  Head:    Normocephalic, without obvious abnormality, atraumatic  Eyes:    PERRL, conjunctiva/corneas clear, EOM's intact, fundi    benign  Ears:    Normal TM's and external ear canals  Nose:   Nares normal, mucosa normal, no drainage or sinus   tenderness  Throat:   Lips, mucosa, and tongue normal; teeth and gums normal  Neck:   Supple, no lymphadenopathy;  thyroid:  no   enlargement/tenderness/nodules; no carotid   bruit or JVD  Back:    Spine nontender, no curvature, ROM normal, no CVA     tenderness  Lungs:     Clear to auscultation bilaterally without wheezes, rales or     ronchi; respirations unlabored  Chest Wall:    No tenderness or deformity   Heart:    Regular rate and rhythm, S1 and S2 normal, no murmur, rub   or gallop  Breast Exam:    No chest wall tenderness, masses or gynecomastia  Abdomen:     Soft, non-tender, nondistended, normoactive bowel sounds,    no masses, no hepatosplenomegaly        Extremities:   No clubbing, cyanosis or edema  Pulses:   2+ and symmetric all extremities  Skin:    Skin color, texture, turgor normal, no rashes or lesions  Lymph nodes:   Cervical, supraclavicular, and axillary nodes normal  Neurologic:   CNII-XII intact, normal strength, sensation and gait; reflexes 2+ and symmetric throughout          Psych:   Normal mood, affect, hygiene and grooming.          Assessment & Plan:  Routine general medical examination at a health care facility - Plan: POCT Urinalysis Dipstick, CBC with Differential/Platelet, Comprehensive metabolic panel, Lipid panel  Hyperlipidemia LDL goal <100 - Plan: Lipid panel  Allergic rhinitis due to pollen, unspecified rhinitis seasonality  Screen for STD (sexually transmitted disease) - Plan: HIV antibody, RPR  Need for hepatitis C screening test - Plan: Hepatitis C antibody  Need for hepatitis B screening test - Plan: Hepatitis B surface antibody  Need for prophylactic vaccination and inoculation against influenza - Plan: Flu Vaccine QUAD 36+ mos PF IM (Fluarix & Fluzone Quad PF)  History of colonic polyps  I encouraged him to become more physically active as he is rather sedentary.

## 2015-01-04 LAB — RPR

## 2015-01-04 LAB — HEPATITIS C ANTIBODY: HCV Ab: NEGATIVE

## 2015-01-04 LAB — HIV ANTIBODY (ROUTINE TESTING W REFLEX): HIV 1&2 Ab, 4th Generation: NONREACTIVE

## 2015-01-04 LAB — HEPATITIS B SURFACE ANTIBODY, QUANTITATIVE: Hepatitis B-Post: 26.8 m[IU]/mL

## 2016-01-16 ENCOUNTER — Ambulatory Visit (INDEPENDENT_AMBULATORY_CARE_PROVIDER_SITE_OTHER): Payer: Commercial Managed Care - HMO | Admitting: Family Medicine

## 2016-01-16 ENCOUNTER — Encounter: Payer: Self-pay | Admitting: Family Medicine

## 2016-01-16 VITALS — BP 124/84 | HR 82 | Ht 75.0 in | Wt 216.0 lb

## 2016-01-16 DIAGNOSIS — E785 Hyperlipidemia, unspecified: Secondary | ICD-10-CM

## 2016-01-16 DIAGNOSIS — Z113 Encounter for screening for infections with a predominantly sexual mode of transmission: Secondary | ICD-10-CM

## 2016-01-16 DIAGNOSIS — Z86010 Personal history of colonic polyps: Secondary | ICD-10-CM | POA: Diagnosis not present

## 2016-01-16 DIAGNOSIS — Z0000 Encounter for general adult medical examination without abnormal findings: Secondary | ICD-10-CM | POA: Diagnosis not present

## 2016-01-16 DIAGNOSIS — Z23 Encounter for immunization: Secondary | ICD-10-CM

## 2016-01-16 DIAGNOSIS — Z125 Encounter for screening for malignant neoplasm of prostate: Secondary | ICD-10-CM

## 2016-01-16 LAB — CBC WITH DIFFERENTIAL/PLATELET
Basophils Absolute: 99 cells/uL (ref 0–200)
Basophils Relative: 1 %
Eosinophils Absolute: 99 cells/uL (ref 15–500)
Eosinophils Relative: 1 %
HCT: 46.7 % (ref 38.5–50.0)
Hemoglobin: 15.7 g/dL (ref 13.2–17.1)
Lymphocytes Relative: 22 %
Lymphs Abs: 2178 cells/uL (ref 850–3900)
MCH: 29.5 pg (ref 27.0–33.0)
MCHC: 33.6 g/dL (ref 32.0–36.0)
MCV: 87.6 fL (ref 80.0–100.0)
MPV: 9.5 fL (ref 7.5–12.5)
Monocytes Absolute: 495 cells/uL (ref 200–950)
Monocytes Relative: 5 %
Neutro Abs: 7029 cells/uL (ref 1500–7800)
Neutrophils Relative %: 71 %
Platelets: 256 10*3/uL (ref 140–400)
RBC: 5.33 MIL/uL (ref 4.20–5.80)
RDW: 14.2 % (ref 11.0–15.0)
WBC: 9.9 10*3/uL (ref 4.0–10.5)

## 2016-01-16 LAB — COMPREHENSIVE METABOLIC PANEL
ALT: 46 U/L (ref 9–46)
AST: 35 U/L (ref 10–35)
Albumin: 4.7 g/dL (ref 3.6–5.1)
Alkaline Phosphatase: 75 U/L (ref 40–115)
BUN: 27 mg/dL — ABNORMAL HIGH (ref 7–25)
CO2: 25 mmol/L (ref 20–31)
Calcium: 9.6 mg/dL (ref 8.6–10.3)
Chloride: 104 mmol/L (ref 98–110)
Creat: 1.55 mg/dL — ABNORMAL HIGH (ref 0.70–1.25)
Glucose, Bld: 106 mg/dL — ABNORMAL HIGH (ref 65–99)
Potassium: 4.1 mmol/L (ref 3.5–5.3)
Sodium: 140 mmol/L (ref 135–146)
Total Bilirubin: 0.7 mg/dL (ref 0.2–1.2)
Total Protein: 7.5 g/dL (ref 6.1–8.1)

## 2016-01-16 LAB — POCT URINALYSIS DIPSTICK
Bilirubin, UA: NEGATIVE
Blood, UA: NEGATIVE
Glucose, UA: NEGATIVE
Ketones, UA: NEGATIVE
Leukocytes, UA: NEGATIVE
Nitrite, UA: NEGATIVE
Protein, UA: NEGATIVE
Spec Grav, UA: >=1.03
Urobilinogen, UA: NEGATIVE
pH, UA: 6.5

## 2016-01-16 LAB — LIPID PANEL
Cholesterol: 228 mg/dL — ABNORMAL HIGH (ref <200)
HDL: 32 mg/dL — ABNORMAL LOW (ref >40)
LDL Cholesterol: 144 mg/dL — ABNORMAL HIGH (ref <100)
Total CHOL/HDL Ratio: 7.1 Ratio — ABNORMAL HIGH (ref <5.0)
Triglycerides: 260 mg/dL — ABNORMAL HIGH (ref <150)
VLDL: 52 mg/dL — ABNORMAL HIGH (ref <30)

## 2016-01-16 LAB — PSA: PSA: 0.5 ng/mL (ref <=4.0)

## 2016-01-16 NOTE — Progress Notes (Signed)
   Subjective:    Patient ID: Curtis Walker, male    DOB: [birth year], 60 y.o.   MRN: [id number]  HPI He is here for complete examination. He does have underlying allergies and is taking care of them with OTC meds on an as-needed basis. He did have a colonoscopy in 2016 which did show tubular adenoma. He will need a repeat in 5 years. He does have a history of hyperlipidemia but has no other major risk factors for heart disease. He's had PSA testing in the past and would like a repeat. He is in a long-term relationship but does occasionally play outside of that. He would like STD testing. His work is going well. Family and social history as well as health maintenance and immunizations were reviewed. He does not exercise on a regular basis.  Review of Systems  All other systems reviewed and are negative.      Objective:   Physical Exam BP 124/84   Pulse 82   Ht 6\' 3"  (1.905 m)   Wt 216 lb (98 kg)   SpO2 97%   BMI 27.00 kg/m   General Appearance:    Alert, cooperative, no distress, appears stated age  Head:    Normocephalic, without obvious abnormality, atraumatic  Eyes:    PERRL, conjunctiva/corneas clear, EOM's intact, fundi    benign  Ears:    Normal TM's and external ear canals  Nose:   Nares normal, mucosa normal, no drainage or sinus   tenderness  Throat:   Lips, mucosa, and tongue normal; teeth and gums normal  Neck:   Supple, no lymphadenopathy;  thyroid:  no   enlargement/tenderness/nodules; no carotid   bruit or JVD     Lungs:     Clear to auscultation bilaterally without wheezes, rales or     ronchi; respirations unlabored      Heart:    Regular rate and rhythm, S1 and S2 normal, no murmur, rub   or gallop     Abdomen:     Soft, non-tender, nondistended, normoactive bowel sounds,    no masses, no hepatosplenomegaly  Genitalia:    Normal male external genitalia without lesions.  Testicles without masses.  No inguinal hernias.  Rectal:    Deferred   Extremities:   No  clubbing, cyanosis or edema  Pulses:   2+ and symmetric all extremities  Skin:   Skin color, texture, turgor normal, no rashes or lesions  Lymph nodes:   Cervical, supraclavicular, and axillary nodes normal  Neurologic:   CNII-XII intact, normal strength, sensation and gait; reflexes 2+ and symmetric throughout          Psych:   Normal mood, affect, hygiene and grooming.          Assessment & Plan:  Routine general medical examination at a health care facility - Plan: POCT Urinalysis Dipstick, CBC with Differential/Platelet, Comprehensive metabolic panel, Lipid panel  Hyperlipidemia LDL goal <100 - Plan: Lipid panel  Screen for STD (sexually transmitted disease) - Plan: HIV antibody, RPR  History of colonic polyps  Need for prophylactic vaccination and inoculation against influenza - Plan: Flu Vaccine QUAD 36+ mos PF IM (Fluarix & Fluzone Quad PF)  Screening for prostate cancer - Plan: PSA I encouraged him to continue to take good care of himself.

## 2016-01-17 LAB — RPR

## 2016-01-17 LAB — HIV ANTIBODY (ROUTINE TESTING W REFLEX): HIV 1&2 Ab, 4th Generation: NONREACTIVE

## 2016-04-09 ENCOUNTER — Encounter: Payer: Self-pay | Admitting: Family Medicine

## 2016-04-10 MED ORDER — TADALAFIL 20 MG PO TABS: 20.0000 mg | ORAL_TABLET | Freq: Every day | ORAL | 1 refills | Status: DC | PRN

## 2016-05-07 ENCOUNTER — Encounter: Payer: Self-pay | Admitting: Family Medicine

## 2016-05-24 MED ORDER — SILDENAFIL CITRATE 20 MG PO TABS: ORAL_TABLET | ORAL | 5 refills | Status: DC

## 2017-01-16 ENCOUNTER — Ambulatory Visit: Payer: BLUE CROSS/BLUE SHIELD | Admitting: Family Medicine

## 2017-01-16 ENCOUNTER — Encounter: Payer: Self-pay | Admitting: Family Medicine

## 2017-01-16 VITALS — BP 120/80 | HR 72 | Resp 18 | Ht 74.5 in | Wt 217.6 lb

## 2017-01-16 DIAGNOSIS — Z0000 Encounter for general adult medical examination without abnormal findings: Secondary | ICD-10-CM | POA: Diagnosis not present

## 2017-01-16 DIAGNOSIS — Z860100 Personal history of colon polyps, unspecified: Secondary | ICD-10-CM

## 2017-01-16 DIAGNOSIS — E785 Hyperlipidemia, unspecified: Secondary | ICD-10-CM | POA: Diagnosis not present

## 2017-01-16 DIAGNOSIS — N529 Male erectile dysfunction, unspecified: Secondary | ICD-10-CM | POA: Diagnosis not present

## 2017-01-16 DIAGNOSIS — Z23 Encounter for immunization: Secondary | ICD-10-CM | POA: Diagnosis not present

## 2017-01-16 DIAGNOSIS — J301 Allergic rhinitis due to pollen: Secondary | ICD-10-CM | POA: Diagnosis not present

## 2017-01-16 DIAGNOSIS — Z87891 Personal history of nicotine dependence: Secondary | ICD-10-CM | POA: Diagnosis not present

## 2017-01-16 DIAGNOSIS — Z209 Contact with and (suspected) exposure to unspecified communicable disease: Secondary | ICD-10-CM

## 2017-01-16 LAB — POCT URINALYSIS DIP (PROADVANTAGE DEVICE)
Bilirubin, UA: NEGATIVE
Blood, UA: NEGATIVE
Glucose, UA: NEGATIVE mg/dL
Ketones, POC UA: NEGATIVE mg/dL
Leukocytes, UA: NEGATIVE
Nitrite, UA: NEGATIVE
Protein Ur, POC: NEGATIVE mg/dL
Specific Gravity, Urine: 1.03
Urobilinogen, Ur: NEGATIVE
pH, UA: 6 (ref 5.0–8.0)

## 2017-01-16 NOTE — Patient Instructions (Signed)
Try Flonase or Rhinocort nasal spray to see what that will do to help with the snoring and also try to lay on your side

## 2017-01-16 NOTE — Progress Notes (Signed)
   Subjective:    Patient ID: Curtis Walker, male    DOB: 06-30-1955, 61 y.o.   MRN: [id number]  HPI He is here for complete examination.  History of allergies but is having no difficulty with that.  He does have a history of colonic polyp which was tubular in nature and is set up for follow-up.  He does have occasional difficulty with ED and does use sildenafil on an as-needed basis.  Past history of hyperlipidemia but is presently not on any medication.  He would like to be tested for STD as he sometimes has sex outside of his relationship.  He is in a 30+ year relationship.  Work and home life are going well.  Otherwise his family and social history as well as health maintenance and immunizations was reviewed   Review of Systems  All other systems reviewed and are negative.      Objective:   Physical Exam BP 120/80   Pulse 72   Resp 18   Ht 6' 2.5" (1.892 m)   Wt 217 lb 9.6 oz (98.7 kg)   SpO2 96%   BMI 27.56 kg/m   General Appearance:    Alert, cooperative, no distress, appears stated age  Head:    Normocephalic, without obvious abnormality, atraumatic  Eyes:    PERRL, conjunctiva/corneas clear, EOM's intact, fundi    benign  Ears:    Normal TM's and external ear canals  Nose:   Nares normal, mucosa normal, no drainage or sinus   tenderness  Throat:   Lips, mucosa, and tongue normal; teeth and gums normal  Neck:   Supple, no lymphadenopathy;  thyroid:  no   enlargement/tenderness/nodules; no carotid   bruit or JVD     Lungs:     Clear to auscultation bilaterally without wheezes, rales or     ronchi; respirations unlabored      Heart:    Regular rate and rhythm, S1 and S2 normal, no murmur, rub   or gallop     Abdomen:     Soft, non-tender, nondistended, normoactive bowel sounds,    no masses, no hepatosplenomegaly  Genitalia:    Normal male external genitalia without lesions.  Testicles without masses.  No inguinal hernias.  Rectal:   Deferred  Extremities:   No  clubbing, cyanosis or edema  Pulses:   2+ and symmetric all extremities  Skin:   Skin color, texture, turgor normal, no rashes or lesions  Lymph nodes:   Cervical, supraclavicular, and axillary nodes normal  Neurologic:   CNII-XII intact, normal strength, sensation and gait; reflexes 2+ and symmetric throughout          Psych:   Normal mood, affect, hygiene and grooming.           Assessment & Plan:  Routine general medical examination at a health care facility - Plan: POCT Urinalysis DIP (Proadvantage Device), CBC with Differential/Platelet, Comprehensive metabolic panel, Lipid panel  Need for shingles vaccine - Plan: Varicella-zoster vaccine IM (Shingrix)  Need for influenza vaccination - Plan: Flu Vaccine QUAD 6+ mos PF IM (Fluarix Quad PF)  Hyperlipidemia LDL goal <100 - Plan: Lipid panel  History of colonic polyps - Tubular adenoma  Contact with or exposure to communicable disease - Plan: HIV antibody, RPR  I encouraged him to continue to take good care of himself.  Also discussed his retirement in several years.  Discussed the need for him to keep himself both physically and mentally busy.

## 2017-01-17 LAB — LIPID PANEL
Cholesterol: 253 mg/dL — ABNORMAL HIGH (ref <200)
HDL: 34 mg/dL — ABNORMAL LOW (ref >40)
LDL Cholesterol (Calc): 185 mg/dL (calc) — ABNORMAL HIGH
Non-HDL Cholesterol (Calc): 219 mg/dL (calc) — ABNORMAL HIGH (ref <130)
Total CHOL/HDL Ratio: 7.4 (calc) — ABNORMAL HIGH (ref <5.0)
Triglycerides: 187 mg/dL — ABNORMAL HIGH (ref <150)

## 2017-01-17 LAB — CBC WITH DIFFERENTIAL/PLATELET
Basophils Absolute: 98 cells/uL (ref 0–200)
Basophils Relative: 1.2 %
Eosinophils Absolute: 131 cells/uL (ref 15–500)
Eosinophils Relative: 1.6 %
HCT: 45.1 % (ref 38.5–50.0)
Hemoglobin: 15.4 g/dL (ref 13.2–17.1)
Lymphs Abs: 2157 cells/uL (ref 850–3900)
MCH: 30 pg (ref 27.0–33.0)
MCHC: 34.1 g/dL (ref 32.0–36.0)
MCV: 87.9 fL (ref 80.0–100.0)
MPV: 9.9 fL (ref 7.5–12.5)
Monocytes Relative: 5.5 %
Neutro Abs: 5363 cells/uL (ref 1500–7800)
Neutrophils Relative %: 65.4 %
Platelets: 255 10*3/uL (ref 140–400)
RBC: 5.13 10*6/uL (ref 4.20–5.80)
RDW: 12.4 % (ref 11.0–15.0)
Total Lymphocyte: 26.3 %
WBC mixed population: 451 cells/uL (ref 200–950)
WBC: 8.2 10*3/uL (ref 3.8–10.8)

## 2017-01-17 LAB — COMPREHENSIVE METABOLIC PANEL
AG Ratio: 1.6 (calc) (ref 1.0–2.5)
ALT: 46 U/L (ref 9–46)
AST: 28 U/L (ref 10–35)
Albumin: 4.4 g/dL (ref 3.6–5.1)
Alkaline phosphatase (APISO): 70 U/L (ref 40–115)
BUN/Creatinine Ratio: 21 (calc) (ref 6–22)
BUN: 27 mg/dL — ABNORMAL HIGH (ref 7–25)
CO2: 26 mmol/L (ref 20–32)
Calcium: 9.5 mg/dL (ref 8.6–10.3)
Chloride: 106 mmol/L (ref 98–110)
Creat: 1.27 mg/dL — ABNORMAL HIGH (ref 0.70–1.25)
Globulin: 2.7 g/dL (calc) (ref 1.9–3.7)
Glucose, Bld: 102 mg/dL — ABNORMAL HIGH (ref 65–99)
Potassium: 4.1 mmol/L (ref 3.5–5.3)
Sodium: 142 mmol/L (ref 135–146)
Total Bilirubin: 0.7 mg/dL (ref 0.2–1.2)
Total Protein: 7.1 g/dL (ref 6.1–8.1)

## 2017-01-17 LAB — HIV ANTIBODY (ROUTINE TESTING W REFLEX): HIV 1&2 Ab, 4th Generation: NONREACTIVE

## 2017-01-17 LAB — RPR: RPR Ser Ql: NONREACTIVE

## 2017-02-13 ENCOUNTER — Encounter: Payer: Commercial Managed Care - HMO | Admitting: Family Medicine

## 2017-03-20 ENCOUNTER — Other Ambulatory Visit (INDEPENDENT_AMBULATORY_CARE_PROVIDER_SITE_OTHER): Payer: BLUE CROSS/BLUE SHIELD

## 2017-03-20 DIAGNOSIS — Z23 Encounter for immunization: Secondary | ICD-10-CM | POA: Diagnosis not present

## 2017-12-21 ENCOUNTER — Encounter: Payer: Self-pay | Admitting: Family Medicine

## 2017-12-22 MED ORDER — SILDENAFIL CITRATE 20 MG PO TABS: ORAL_TABLET | ORAL | 5 refills | Status: DC

## 2018-01-13 ENCOUNTER — Encounter: Payer: Self-pay | Admitting: Family Medicine

## 2018-01-13 LAB — HM COLONOSCOPY

## 2018-01-16 ENCOUNTER — Encounter: Payer: Self-pay | Admitting: Family Medicine

## 2018-01-19 ENCOUNTER — Encounter: Payer: Self-pay | Admitting: Family Medicine

## 2018-01-19 ENCOUNTER — Ambulatory Visit: Payer: BLUE CROSS/BLUE SHIELD | Admitting: Family Medicine

## 2018-01-19 VITALS — BP 132/80 | HR 82 | Temp 98.1°F | Ht 74.0 in | Wt 212.6 lb

## 2018-01-19 DIAGNOSIS — Z87891 Personal history of nicotine dependence: Secondary | ICD-10-CM

## 2018-01-19 DIAGNOSIS — Z86010 Personal history of colonic polyps: Secondary | ICD-10-CM | POA: Diagnosis not present

## 2018-01-19 DIAGNOSIS — Z23 Encounter for immunization: Secondary | ICD-10-CM

## 2018-01-19 DIAGNOSIS — Z0000 Encounter for general adult medical examination without abnormal findings: Secondary | ICD-10-CM | POA: Diagnosis not present

## 2018-01-19 DIAGNOSIS — N529 Male erectile dysfunction, unspecified: Secondary | ICD-10-CM

## 2018-01-19 DIAGNOSIS — J301 Allergic rhinitis due to pollen: Secondary | ICD-10-CM

## 2018-01-19 DIAGNOSIS — N486 Induration penis plastica: Secondary | ICD-10-CM

## 2018-01-19 DIAGNOSIS — E785 Hyperlipidemia, unspecified: Secondary | ICD-10-CM | POA: Diagnosis not present

## 2018-01-19 DIAGNOSIS — Z209 Contact with and (suspected) exposure to unspecified communicable disease: Secondary | ICD-10-CM

## 2018-01-19 NOTE — Progress Notes (Signed)
Subjective:    Patient ID: Curtis Walker, male    DOB: 08-14-1955, 62 y.o.   MRN: [id number]  HPI He is here for complete examination.  He is in the process of getting evaluated and treated for his history of colonic polyps.  The scope was difficult to pass and he will therefore have a barium enema to further evaluate his colon.  His allergies seem to be under good control.  He does use sildenafil for his EGD.  He has noted over the last several months the curvature of his penis when erect.  This is bothersome to him. He is in a 30-year relationship but apparently does play outside the relationship.  He does have a history of hyperlipidemia.  Also a remote history of smoking.  Otherwise his family and social history as well as health maintenance and immunizations were reviewed.  Work is going well.  He is not ready to retire.  Review of Systems     Objective:   Physical Exam BP 132/80 (BP Location: Left Arm, Patient Position: Sitting)   Pulse 82   Temp 98.1 F (36.7 C)   Ht 6\' 2"  (1.88 m)   Wt 212 lb 9.6 oz (96.4 kg)   SpO2 98%   BMI 27.30 kg/m   General Appearance:    Alert, cooperative, no distress, appears stated age  Head:    Normocephalic, without obvious abnormality, atraumatic  Eyes:    PERRL, conjunctiva/corneas clear, EOM's intact, fundi    benign  Ears:    Normal TM's and external ear canals  Nose:   Nares normal, mucosa normal, no drainage or sinus   tenderness  Throat:   Lips, mucosa, and tongue normal; teeth and gums normal  Neck:   Supple, no lymphadenopathy;  thyroid:  no   enlargement/tenderness/nodules; no carotid   bruit or JVD     Lungs:     Clear to auscultation bilaterally without wheezes, rales or     ronchi; respirations unlabored      Heart:    Regular rate and rhythm, S1 and S2 normal, no murmur, rub   or gallop     Abdomen:     Soft, non-tender, nondistended, normoactive bowel sounds,    no masses, no hepatosplenomegaly  Genitalia:  deferred    Rectal:  deferred  Extremities:   No clubbing, cyanosis or edema  Pulses:   2+ and symmetric all extremities  Skin:   Skin color, texture, turgor normal, no rashes or lesions  Lymph nodes:   Cervical, supraclavicular, and axillary nodes normal  Neurologic:   CNII-XII intact, normal strength, sensation and gait; reflexes 2+ and symmetric throughout          Psych:   Normal mood, affect, hygiene and grooming.           Assessment & Plan:  Routine general medical examination at a health care facility - Plan: CBC with Differential/Platelet, Comprehensive metabolic panel, Lipid panel  Former smoker, stopped smoking in distant past  Erectile dysfunction, unspecified erectile dysfunction type  Seasonal allergic rhinitis due to pollen  History of colonic polyps  Hyperlipidemia LDL goal <100 - Plan: Lipid panel  Need for influenza vaccination - Plan: Flu Vaccine QUAD 6+ mos PF IM (Fluarix Quad PF)  Peyronie's disease - Plan: Ambulatory referral to Urology  Contact with or exposure to communicable disease - Plan: HIV Antibody (routine testing w rflx), GC/Chlamydia Probe Amp, GC/Chlamydia Probe Amp, RPR  I discussed follow-up immunizations with him.  Also discussed the need for AAA evaluation when he turns 84.  He will continue to treat his allergies.  He will follow-up with GI as already scheduled.  Send him to urology for evaluation of the Peyronie's disease.

## 2018-01-19 NOTE — Patient Instructions (Signed)
20 minutes of something physical daily or 150 minutes a week of something physical

## 2018-01-20 ENCOUNTER — Other Ambulatory Visit: Payer: Self-pay | Admitting: Gastroenterology

## 2018-01-20 DIAGNOSIS — R933 Abnormal findings on diagnostic imaging of other parts of digestive tract: Secondary | ICD-10-CM

## 2018-01-20 LAB — CBC WITH DIFFERENTIAL/PLATELET
Basophils Absolute: 0.1 10*3/uL (ref 0.0–0.2)
Basos: 1 %
EOS (ABSOLUTE): 0.1 10*3/uL (ref 0.0–0.4)
Eos: 1 %
Hematocrit: 46.3 % (ref 37.5–51.0)
Hemoglobin: 15.4 g/dL (ref 13.0–17.7)
Immature Grans (Abs): 0.1 10*3/uL (ref 0.0–0.1)
Immature Granulocytes: 1 %
Lymphocytes Absolute: 2.7 10*3/uL (ref 0.7–3.1)
Lymphs: 24 %
MCH: 29.4 pg (ref 26.6–33.0)
MCHC: 33.3 g/dL (ref 31.5–35.7)
MCV: 88 fL (ref 79–97)
Monocytes Absolute: 0.5 10*3/uL (ref 0.1–0.9)
Monocytes: 5 %
Neutrophils Absolute: 7.7 10*3/uL — ABNORMAL HIGH (ref 1.4–7.0)
Neutrophils: 68 %
Platelets: 377 10*3/uL (ref 150–450)
RBC: 5.24 x10E6/uL (ref 4.14–5.80)
RDW: 12.2 % — ABNORMAL LOW (ref 12.3–15.4)
WBC: 11.2 10*3/uL — ABNORMAL HIGH (ref 3.4–10.8)

## 2018-01-20 LAB — COMPREHENSIVE METABOLIC PANEL
ALT: 37 IU/L (ref 0–44)
AST: 26 IU/L (ref 0–40)
Albumin/Globulin Ratio: 1.9 (ref 1.2–2.2)
Albumin: 4.9 g/dL — ABNORMAL HIGH (ref 3.6–4.8)
Alkaline Phosphatase: 100 IU/L (ref 39–117)
BUN/Creatinine Ratio: 14 (ref 10–24)
BUN: 21 mg/dL (ref 8–27)
Bilirubin Total: 0.5 mg/dL (ref 0.0–1.2)
CO2: 23 mmol/L (ref 20–29)
Calcium: 9.9 mg/dL (ref 8.6–10.2)
Chloride: 102 mmol/L (ref 96–106)
Creatinine, Ser: 1.5 mg/dL — ABNORMAL HIGH (ref 0.76–1.27)
GFR calc Af Amer: 56 mL/min/{1.73_m2} — ABNORMAL LOW (ref >59)
GFR calc non Af Amer: 49 mL/min/{1.73_m2} — ABNORMAL LOW (ref >59)
Globulin, Total: 2.6 g/dL (ref 1.5–4.5)
Glucose: 93 mg/dL (ref 65–99)
Potassium: 4.3 mmol/L (ref 3.5–5.2)
Sodium: 144 mmol/L (ref 134–144)
Total Protein: 7.5 g/dL (ref 6.0–8.5)

## 2018-01-20 LAB — LIPID PANEL
Chol/HDL Ratio: 6.3 ratio — ABNORMAL HIGH (ref 0.0–5.0)
Cholesterol, Total: 232 mg/dL — ABNORMAL HIGH (ref 100–199)
HDL: 37 mg/dL — ABNORMAL LOW (ref >39)
LDL Calculated: 161 mg/dL — ABNORMAL HIGH (ref 0–99)
Triglycerides: 172 mg/dL — ABNORMAL HIGH (ref 0–149)
VLDL Cholesterol Cal: 34 mg/dL (ref 5–40)

## 2018-01-20 LAB — RPR: RPR Ser Ql: NONREACTIVE

## 2018-01-20 LAB — HIV ANTIBODY (ROUTINE TESTING W REFLEX): HIV Screen 4th Generation wRfx: NONREACTIVE

## 2018-01-21 LAB — GC/CHLAMYDIA PROBE AMP
Chlamydia trachomatis, NAA: NEGATIVE
Chlamydia trachomatis, NAA: NEGATIVE
Neisseria gonorrhoeae by PCR: NEGATIVE
Neisseria gonorrhoeae by PCR: NEGATIVE

## 2018-02-06 ENCOUNTER — Other Ambulatory Visit: Payer: Self-pay

## 2018-02-10 ENCOUNTER — Ambulatory Visit
Admission: RE | Admit: 2018-02-10 | Discharge: 2018-02-10 | Disposition: A | Discharge status: Home / Self Care | Payer: BLUE CROSS/BLUE SHIELD | Source: Ambulatory Visit | Attending: Gastroenterology | Admitting: Gastroenterology

## 2018-02-10 DIAGNOSIS — R933 Abnormal findings on diagnostic imaging of other parts of digestive tract: Secondary | ICD-10-CM

## 2018-02-10 LAB — HM COLONOSCOPY

## 2018-02-12 ENCOUNTER — Other Ambulatory Visit: Payer: Self-pay

## 2018-02-26 DIAGNOSIS — K5730 Diverticulosis of large intestine without perforation or abscess without bleeding: Secondary | ICD-10-CM | POA: Insufficient documentation

## 2018-03-09 ENCOUNTER — Encounter: Payer: Self-pay | Admitting: Family Medicine

## 2018-03-10 ENCOUNTER — Other Ambulatory Visit: Payer: Self-pay

## 2018-03-10 DIAGNOSIS — Z86010 Personal history of colonic polyps: Secondary | ICD-10-CM

## 2018-04-14 ENCOUNTER — Encounter: Payer: Self-pay | Admitting: Gastroenterology

## 2018-04-14 ENCOUNTER — Ambulatory Visit: Payer: BLUE CROSS/BLUE SHIELD | Admitting: Gastroenterology

## 2018-04-14 VITALS — BP 144/78 | HR 88 | Ht 75.0 in | Wt 225.6 lb

## 2018-04-14 DIAGNOSIS — Z86010 Personal history of colonic polyps: Secondary | ICD-10-CM

## 2018-04-14 NOTE — Progress Notes (Signed)
Gastroenterology Consultation  Referring Provider:     Denita Lung, MD Primary Care Physician:  Denita Lung, MD Primary Gastroenterologist:  Dr. Allen Norris     Reason for Consultation:     Second opinion        HPI:   Curtis Walker is a 63 y.o. y/o male referred for consultation & management of second opinion by Dr. Redmond School, Elyse Jarvis, MD.  This patient comes in today with a history of an adenomatous polyp at a previous colonoscopy.  The patient had a repeat colonoscopy attempted in December of this past year and was reported to have inability to pass the scope beyond the sigmoid colon.  The patient was then sent for a barium enema which showed spasms without any strictures.  The patient is now concerned because he was told that after the sigmoidoscopy and barium enema that he should consider having a virtual colonoscopy.  The patient lives in town and does not want to go back to [place] if he needs another colonoscopy or another procedure.  He denies any family history of colon cancer or colon polyps.  He denies any change in bowel habits nausea vomiting fevers or chills.  Past Medical History:  Diagnosis Date  . Allergy    RHINITIS  . Dyslipidemia   . History of colonic polyps     Past Surgical History:  Procedure Laterality Date  . COLONOSCOPY  2006   Dr. Benson Norway    Prior to Admission medications   Medication Sig Start Date End Date Taking? Authorizing Provider  naproxen sodium (ALEVE) 220 MG tablet Take 220 mg by mouth.    [provider]  OMEPRAZOLE PO Take by mouth.    [provider]  sildenafil (REVATIO) 20 MG tablet Take 1 to 5 holes daily as needed for erectile dysfunction 12/22/17   Denita Lung, MD    Family History  Problem Relation Age of Onset  . Asthma Mother   . Mental illness Mother   . Arthritis Father   . Hypertension Father   . Hypertension Paternal Grandfather   . Heart disease Paternal Grandfather      Social History    Tobacco Use  . Smoking status: Former Smoker    Last attempt to quit: 01/17/1991    Years since quitting: 27.2  . Smokeless tobacco: Never Used  Substance Use Topics  . Alcohol use: Yes    Comment: rare  . Drug use: No    Allergies as of 04/14/2018 - Review Complete 01/19/2018  Allergen Reaction Noted  . Codeine Itching 01/07/2011    Review of Systems:    All systems reviewed and negative except where noted in HPI.   Physical Exam:  There were no vitals taken for this visit. No LMP for male patient. General:   Alert,  Well-developed, well-nourished, pleasant and cooperative in NAD Head:  Normocephalic and atraumatic. Eyes:  Sclera clear, no icterus.   Conjunctiva pink. Ears:  Normal auditory acuity. Nose:  No deformity, discharge, or lesions. Rectal:  Deferred.  Extremities:  No clubbing or edema.  No cyanosis. Neurologic:  Alert and oriented x3;  grossly normal neurologically. Skin:  Intact without significant lesions or rashes.  No jaundice. Psych:  Alert and cooperative. Normal mood and affect.  Imaging Studies: No results found.  Assessment and Plan:   Curtis Walker is a 63 y.o. y/o male who comes in today after having a failed colonoscopy at another gastroneurologist office concerned  that he has a history of adenomatous polyps and his last colonoscopy with a polyp showed it to be 2 to 3 mm.  The patient has been told that a barium enema will see large lesions but commonly miss small lesions.  The patient reports that he would like to think about whether he wants to undergo another colonoscopy but states he does not want to have the recommended CT colonography.  The patient will call our office if he decides to undergo a colonoscopy.  The patient has been explained the plan and agrees with it.  Lucilla Lame, MD. Marval Regal    Note: This dictation was prepared with Dragon dictation along with smaller phrase technology. Any transcriptional errors that result from this  process are unintentional.

## 2018-10-08 ENCOUNTER — Other Ambulatory Visit: Payer: Self-pay

## 2018-10-08 DIAGNOSIS — Z20822 Contact with and (suspected) exposure to covid-19: Secondary | ICD-10-CM

## 2018-10-10 LAB — NOVEL CORONAVIRUS, NAA: SARS-CoV-2, NAA: NOT DETECTED

## 2018-10-10 LAB — SPECIMEN STATUS REPORT

## 2018-10-21 ENCOUNTER — Telehealth: Payer: Self-pay | Admitting: Gastroenterology

## 2018-10-21 NOTE — Telephone Encounter (Signed)
Patient called stating he was seen in March & is ready to schedule colonoscopy but didn't know if he needed to schedule another office appointment.

## 2018-10-22 NOTE — Telephone Encounter (Signed)
Spoke with pt and discuss dates and locations for his colonoscopy. Pt will discuss with his driver and call me back to schedule.

## 2018-10-26 ENCOUNTER — Other Ambulatory Visit: Payer: Self-pay

## 2018-10-26 DIAGNOSIS — Z86010 Personal history of colonic polyps: Secondary | ICD-10-CM

## 2018-10-26 MED ORDER — PEG 3350-KCL-NA BICARB-NACL 420 G PO SOLR: 4000.0000 mL | Freq: Once | ORAL | 0 refills | Status: AC

## 2018-11-20 ENCOUNTER — Other Ambulatory Visit
Admission: RE | Admit: 2018-11-20 | Discharge: 2018-11-20 | Disposition: A | Discharge status: Home / Self Care | Payer: PPO | Source: Ambulatory Visit | Attending: Gastroenterology | Admitting: Gastroenterology

## 2018-11-20 ENCOUNTER — Other Ambulatory Visit: Payer: Self-pay

## 2018-11-20 DIAGNOSIS — Z01812 Encounter for preprocedural laboratory examination: Secondary | ICD-10-CM | POA: Insufficient documentation

## 2018-11-20 DIAGNOSIS — Z20828 Contact with and (suspected) exposure to other viral communicable diseases: Secondary | ICD-10-CM | POA: Diagnosis not present

## 2018-11-20 LAB — SARS CORONAVIRUS 2 (TAT 6-24 HRS): SARS Coronavirus 2: NEGATIVE

## 2018-11-23 ENCOUNTER — Encounter: Payer: Self-pay | Admitting: *Deleted

## 2018-11-24 ENCOUNTER — Other Ambulatory Visit: Payer: Self-pay

## 2018-11-24 ENCOUNTER — Encounter: Payer: Self-pay | Admitting: *Deleted

## 2018-11-24 ENCOUNTER — Ambulatory Visit
Admission: RE | Admit: 2018-11-24 | Discharge: 2018-11-24 | Disposition: A | Discharge status: Home / Self Care | Payer: PPO | Attending: Gastroenterology | Admitting: Gastroenterology

## 2018-11-24 ENCOUNTER — Encounter
Admission: RE | Disposition: A | Discharge status: Home / Self Care | Payer: Self-pay | Source: Home / Self Care | Attending: Gastroenterology

## 2018-11-24 ENCOUNTER — Ambulatory Visit: Payer: PPO | Admitting: Certified Registered"

## 2018-11-24 ENCOUNTER — Ambulatory Visit: Payer: BLUE CROSS/BLUE SHIELD | Admitting: Family Medicine

## 2018-11-24 DIAGNOSIS — D124 Benign neoplasm of descending colon: Secondary | ICD-10-CM | POA: Diagnosis not present

## 2018-11-24 DIAGNOSIS — K621 Rectal polyp: Secondary | ICD-10-CM | POA: Insufficient documentation

## 2018-11-24 DIAGNOSIS — K5730 Diverticulosis of large intestine without perforation or abscess without bleeding: Secondary | ICD-10-CM | POA: Diagnosis not present

## 2018-11-24 DIAGNOSIS — K635 Polyp of colon: Secondary | ICD-10-CM

## 2018-11-24 DIAGNOSIS — D128 Benign neoplasm of rectum: Secondary | ICD-10-CM | POA: Diagnosis not present

## 2018-11-24 DIAGNOSIS — K641 Second degree hemorrhoids: Secondary | ICD-10-CM | POA: Insufficient documentation

## 2018-11-24 DIAGNOSIS — Z09 Encounter for follow-up examination after completed treatment for conditions other than malignant neoplasm: Secondary | ICD-10-CM | POA: Diagnosis not present

## 2018-11-24 DIAGNOSIS — D125 Benign neoplasm of sigmoid colon: Secondary | ICD-10-CM | POA: Diagnosis not present

## 2018-11-24 DIAGNOSIS — Z87891 Personal history of nicotine dependence: Secondary | ICD-10-CM | POA: Insufficient documentation

## 2018-11-24 DIAGNOSIS — D122 Benign neoplasm of ascending colon: Secondary | ICD-10-CM | POA: Insufficient documentation

## 2018-11-24 DIAGNOSIS — Z86010 Personal history of colonic polyps: Secondary | ICD-10-CM | POA: Diagnosis not present

## 2018-11-24 DIAGNOSIS — D126 Benign neoplasm of colon, unspecified: Secondary | ICD-10-CM | POA: Diagnosis not present

## 2018-11-24 DIAGNOSIS — K5790 Diverticulosis of intestine, part unspecified, without perforation or abscess without bleeding: Secondary | ICD-10-CM | POA: Diagnosis not present

## 2018-11-24 DIAGNOSIS — Z1211 Encounter for screening for malignant neoplasm of colon: Secondary | ICD-10-CM | POA: Diagnosis not present

## 2018-11-24 DIAGNOSIS — K648 Other hemorrhoids: Secondary | ICD-10-CM | POA: Diagnosis not present

## 2018-11-24 HISTORY — PX: COLONOSCOPY WITH PROPOFOL: SHX5780

## 2018-11-24 SURGERY — COLONOSCOPY WITH PROPOFOL
Anesthesia: General

## 2018-11-24 MED ORDER — GLYCOPYRROLATE 0.2 MG/ML IJ SOLN
INTRAMUSCULAR | Status: DC | PRN
  Administered 2018-11-24: 0.2 mg via INTRAVENOUS

## 2018-11-24 MED ORDER — SODIUM CHLORIDE 0.9 % IV SOLN
INTRAVENOUS | Status: DC
  Administered 2018-11-24: 07:00:00 via INTRAVENOUS

## 2018-11-24 MED ORDER — PROPOFOL 10 MG/ML IV BOLUS
INTRAVENOUS | Status: AC
  Filled 2018-11-24: qty 40

## 2018-11-24 MED ORDER — PROPOFOL 10 MG/ML IV BOLUS
INTRAVENOUS | Status: DC | PRN
  Administered 2018-11-24: 30 ug via INTRAVENOUS
  Administered 2018-11-24: 50 ug via INTRAVENOUS
  Administered 2018-11-24: 40 ug via INTRAVENOUS
  Administered 2018-11-24: 20 ug via INTRAVENOUS
  Administered 2018-11-24 (×3): 30 ug via INTRAVENOUS
  Administered 2018-11-24: 90 ug via INTRAVENOUS
  Administered 2018-11-24: 20 ug via INTRAVENOUS
  Administered 2018-11-24: 40 ug via INTRAVENOUS
  Administered 2018-11-24: 20 ug via INTRAVENOUS

## 2018-11-24 MED ORDER — PROPOFOL 10 MG/ML IV BOLUS
INTRAVENOUS | Status: AC
  Filled 2018-11-24: qty 20

## 2018-11-24 NOTE — Anesthesia Preprocedure Evaluation (Signed)
Anesthesia Evaluation  Patient identified by MRN, date of birth, ID band Patient awake    Reviewed: Allergy & Precautions, NPO status , Patient's Chart, lab work & pertinent test results  History of Anesthesia Complications Negative for: history of anesthetic complications  Airway Mallampati: II  TM Distance: >3 FB Neck ROM: Full    Dental no notable dental hx.    Pulmonary neg sleep apnea, neg COPD, former smoker,    breath sounds clear to auscultation- rhonchi (-) wheezing      Cardiovascular Exercise Tolerance: Good (-) hypertension(-) CAD, (-) Past MI, (-) Cardiac Stents and (-) CABG  Rhythm:Regular Rate:Normal - Systolic murmurs and - Diastolic murmurs    Neuro/Psych neg Seizures negative neurological ROS  negative psych ROS   GI/Hepatic negative GI ROS, Neg liver ROS,   Endo/Other  negative endocrine ROSneg diabetes  Renal/GU negative Renal ROS     Musculoskeletal negative musculoskeletal ROS (+)   Abdominal (+) - obese,   Peds  Hematology negative hematology ROS (+)   Anesthesia Other Findings Past Medical History: No date: Allergy     Comment:  RHINITIS No date: Dyslipidemia No date: History of colonic polyps   Reproductive/Obstetrics                             Anesthesia Physical Anesthesia Plan  ASA: I  Anesthesia Plan: General   Post-op Pain Management:    Induction: Intravenous  PONV Risk Score and Plan: 1 and Propofol infusion  Airway Management Planned: Natural Airway  Additional Equipment:   Intra-op Plan:   Post-operative Plan:   Informed Consent: I have reviewed the patients History and Physical, chart, labs and discussed the procedure including the risks, benefits and alternatives for the proposed anesthesia with the patient or authorized representative who has indicated his/her understanding and acceptance.     Dental advisory given  Plan  Discussed with: CRNA and Anesthesiologist  Anesthesia Plan Comments:         Anesthesia Quick Evaluation

## 2018-11-24 NOTE — Anesthesia Postprocedure Evaluation (Signed)
Anesthesia Post Note  Patient: Curtis Walker  Procedure(s) Performed: COLONOSCOPY WITH PROPOFOL (N/A )  Patient location during evaluation: Endoscopy Anesthesia Type: General Level of consciousness: awake and alert and oriented Pain management: pain level controlled Vital Signs Assessment: post-procedure vital signs reviewed and stable Respiratory status: spontaneous breathing, nonlabored ventilation and respiratory function stable Cardiovascular status: blood pressure returned to baseline and stable Postop Assessment: no signs of nausea or vomiting Anesthetic complications: no     Last Vitals:  Vitals:   11/24/18 0901 11/24/18 0921  BP: 131/82 131/85  Pulse:    Resp:    Temp:    SpO2:      Last Pain:  Vitals:   11/24/18 0921  TempSrc:   PainSc: 0-No pain                 [name]

## 2018-11-24 NOTE — Anesthesia Post-op Follow-up Note (Signed)
Anesthesia QCDR form completed.        

## 2018-11-24 NOTE — Op Note (Signed)
[facility] Patient Name: Curtis Walker Procedure Date: 11/24/2018 8:09 AM MRN: [id number] Account #: [id number] Date of Birth: 04/27/1955 Admit Type: Outpatient Age: 63 Room: [facility] ENDO ROOM 4 Gender: Male Note Status: Finalized Procedure:            Colonoscopy Indications:          High risk colon cancer surveillance: Personal history                        of colonic polyps Providers:            Lucilla Lame MD, MD Medicines:            Propofol per Anesthesia Procedure:            Pre-Anesthesia Assessment:                       - Prior to the procedure, a History and Physical was                        performed, and patient medications and allergies were                        reviewed. The patient's tolerance of previous                        anesthesia was also reviewed. The risks and benefits of                        the procedure and the sedation options and risks were                        discussed with the patient. All questions were                        answered, and informed consent was obtained. Prior                        Anticoagulants: The patient has taken no previous                        anticoagulant or antiplatelet agents. ASA Grade                        Assessment: II - A patient with mild systemic disease.                        After reviewing the risks and benefits, the patient was                        deemed in satisfactory condition to undergo the                        procedure.                       After obtaining informed consent, the colonoscope was                        passed under direct vision. Throughout the procedure,  the patient's blood pressure, pulse, and oxygen                        saturations were monitored continuously. The                        Colonoscope was introduced through the anus and                        advanced to the the cecum, identified by  appendiceal                        orifice and ileocecal valve. The patient tolerated the                        procedure well. The quality of the bowel preparation                        was good. The colonoscopy was somewhat difficult due to                        restricted mobility of the colon. Successful completion                        of the procedure was aided by withdrawing the scope and                        replacing with the pediatric colonoscope. Findings:      The perianal and digital rectal examinations were normal.      A 2 mm polyp was found in the ascending colon. The polyp was sessile.       The polyp was removed with a cold biopsy forceps. Resection and       retrieval were complete.      A 4 mm polyp was found in the descending colon. The polyp was sessile.       The polyp was removed with a cold biopsy forceps. Resection and       retrieval were complete.      A 3 mm polyp was found in the sigmoid colon. The polyp was sessile. The       polyp was removed with a cold biopsy forceps. Resection and retrieval       were complete.      Two sessile polyps were found in the rectum. The polyps were 2 to 3 mm       in size. These polyps were removed with a cold biopsy forceps. Resection       and retrieval were complete.      Multiple small-mouthed diverticula were found in the sigmoid colon.      Non-bleeding internal hemorrhoids were found during retroflexion. The       hemorrhoids were Grade II (internal hemorrhoids that prolapse but reduce       spontaneously). Impression:           - One 2 mm polyp in the ascending colon, removed with a                        cold biopsy forceps. Resected and retrieved.                       -  One 4 mm polyp in the descending colon, removed with                        a cold biopsy forceps. Resected and retrieved.                       - One 3 mm polyp in the sigmoid colon, removed with a                        cold biopsy forceps.  Resected and retrieved.                       - Two 2 to 3 mm polyps in the rectum, removed with a                        cold biopsy forceps. Resected and retrieved.                       - Diverticulosis in the sigmoid colon.                       - Non-bleeding internal hemorrhoids. Recommendation:       - Discharge patient to home.                       - Resume previous diet.                       - Continue present medications.                       - Await pathology results.                       - Repeat colonoscopy in 5 years for surveillance. Procedure Code(s):    --- Professional ---                       [phone], Colonoscopy, flexible; with biopsy, single or                        multiple Diagnosis Code(s):    --- Professional ---                       Z86.010, Personal history of colonic polyps                       K63.5, Polyp of colon CPT copyright 2019 American Medical Association. All rights reserved. The codes documented in this report are preliminary and upon coder review may  be revised to meet current compliance requirements. Lucilla Lame MD, MD 11/24/2018 8:51:05 AM This report has been signed electronically. Number of Addenda: 0 Note Initiated On: 11/24/2018 8:09 AM Scope Withdrawal Time: 0 hours 11 minutes 15 seconds  Total Procedure Duration: 0 hours 29 minutes 26 seconds       [facility]

## 2018-11-24 NOTE — Transfer of Care (Signed)
Immediate Anesthesia Transfer of Care Note  Patient: Curtis Walker  Procedure(s) Performed: COLONOSCOPY WITH PROPOFOL (N/A )  Patient Location: Endoscopy Unit  Anesthesia Type:General  Level of Consciousness: awake, alert  and oriented  Airway & Oxygen Therapy: Patient Spontanous Breathing  Post-op Assessment: Report given to RN  Post vital signs: stable  Last Vitals:  Vitals Value Taken Time  BP    Temp    Pulse    Resp    SpO2      Last Pain:  Vitals:   11/24/18 0715  TempSrc: Tympanic         Complications: No apparent anesthesia complications

## 2018-11-24 NOTE — H&P (Signed)
Lucilla Lame, MD [facility] [address] [place], [place] [address] [phone] Fax : [phone]  Primary Care Physician:  Denita Lung, MD Primary Gastroenterologist:  Dr. Allen Norris  Pre-Procedure History & Physical: HPI:  Curtis Walker is a 63 y.o. male is here for an colonoscopy.   Past Medical History:  Diagnosis Date  . Allergy    RHINITIS  . Dyslipidemia   . History of colonic polyps     Past Surgical History:  Procedure Laterality Date  . COLONOSCOPY  2006   Dr. Benson Norway  . fracture repair right humerus      Prior to Admission medications   Medication Sig Start Date End Date Taking? Authorizing Provider  naproxen sodium (ALEVE) 220 MG tablet Take 220 mg by mouth.   Yes [provider]  OMEPRAZOLE PO Take by mouth.   Yes [provider]  sildenafil (REVATIO) 20 MG tablet Take 1 to 5 holes daily as needed for erectile dysfunction 12/22/17   Denita Lung, MD    Allergies as of 10/26/2018 - Review Complete 04/14/2018  Allergen Reaction Noted  . Codeine Itching 01/07/2011    Family History  Problem Relation Age of Onset  . Asthma Mother   . Mental illness Mother   . Arthritis Father   . Hypertension Father   . Hypertension Paternal Grandfather   . Heart disease Paternal Grandfather     Social History   Socioeconomic History  . Marital status: Soil scientist    Spouse name: Not on file  . Number of children: Not on file  . Years of education: Not on file  . Highest education level: Not on file  Occupational History  . Not on file  Social Needs  . Financial resource strain: Not on file  . Food insecurity    Worry: Not on file    Inability: Not on file  . Transportation needs    Medical: Not on file    Non-medical: Not on file  Tobacco Use  . Smoking status: Former Smoker    Quit date: 01/17/1991    Years since quitting: 27.8  . Smokeless tobacco: Never Used  Substance and Sexual Activity  . Alcohol use: Yes   Comment: rare  . Drug use: No  . Sexual activity: Yes  Lifestyle  . Physical activity    Days per week: Not on file    Minutes per session: Not on file  . Stress: Not on file  Relationships  . Social Herbalist on phone: Not on file    Gets together: Not on file    Attends religious service: Not on file    Active member of club or organization: Not on file    Attends meetings of clubs or organizations: Not on file    Relationship status: Not on file  . Intimate partner violence    Fear of current or ex partner: Not on file    Emotionally abused: Not on file    Physically abused: Not on file    Forced sexual activity: Not on file  Other Topics Concern  . Not on file  Social History Narrative  . Not on file    Review of Systems: See HPI, otherwise negative ROS  Physical Exam: BP (!) 141/89   Pulse 88   Temp 97.9 F (36.6 C) (Tympanic)   Resp 16   Ht 6\' 3"  (1.905 m)   Wt 95.3 kg   SpO2 98%  BMI 26.25 kg/m  General:   Alert,  pleasant and cooperative in NAD Head:  Normocephalic and atraumatic. Neck:  Supple; no masses or thyromegaly. Lungs:  Clear throughout to auscultation.    Heart:  Regular rate and rhythm. Abdomen:  Soft, nontender and nondistended. Normal bowel sounds, without guarding, and without rebound.   Neurologic:  Alert and  oriented x4;  grossly normal neurologically.  Impression/Plan: Curtis Walker is here for an colonoscopy to be performed for adenomatous polyps 11/29/2014  Risks, benefits, limitations, and alternatives regarding  colonoscopy have been reviewed with the patient.  Questions have been answered.  All parties agreeable.   Lucilla Lame, MD  11/24/2018, 7:48 AM

## 2018-11-25 ENCOUNTER — Encounter: Payer: Self-pay | Admitting: Gastroenterology

## 2018-11-25 LAB — SURGICAL PATHOLOGY

## 2018-11-26 ENCOUNTER — Encounter: Payer: Self-pay | Admitting: Gastroenterology

## 2018-11-26 DIAGNOSIS — D369 Benign neoplasm, unspecified site: Secondary | ICD-10-CM | POA: Insufficient documentation

## 2018-12-14 ENCOUNTER — Encounter: Payer: Self-pay | Admitting: Family Medicine

## 2018-12-14 ENCOUNTER — Ambulatory Visit (INDEPENDENT_AMBULATORY_CARE_PROVIDER_SITE_OTHER): Payer: PPO | Admitting: Family Medicine

## 2018-12-14 ENCOUNTER — Other Ambulatory Visit: Payer: Self-pay

## 2018-12-14 VITALS — BP 140/92 | HR 69 | Temp 98.0°F | Ht 75.5 in | Wt 217.6 lb

## 2018-12-14 DIAGNOSIS — J301 Allergic rhinitis due to pollen: Secondary | ICD-10-CM | POA: Diagnosis not present

## 2018-12-14 DIAGNOSIS — Z136 Encounter for screening for cardiovascular disorders: Secondary | ICD-10-CM

## 2018-12-14 DIAGNOSIS — N486 Induration penis plastica: Secondary | ICD-10-CM

## 2018-12-14 DIAGNOSIS — Z86010 Personal history of colonic polyps: Secondary | ICD-10-CM

## 2018-12-14 DIAGNOSIS — E785 Hyperlipidemia, unspecified: Secondary | ICD-10-CM | POA: Diagnosis not present

## 2018-12-14 DIAGNOSIS — R030 Elevated blood-pressure reading, without diagnosis of hypertension: Secondary | ICD-10-CM | POA: Diagnosis not present

## 2018-12-14 DIAGNOSIS — I10 Essential (primary) hypertension: Secondary | ICD-10-CM | POA: Insufficient documentation

## 2018-12-14 DIAGNOSIS — Z209 Contact with and (suspected) exposure to unspecified communicable disease: Secondary | ICD-10-CM

## 2018-12-14 DIAGNOSIS — N529 Male erectile dysfunction, unspecified: Secondary | ICD-10-CM

## 2018-12-14 DIAGNOSIS — K5730 Diverticulosis of large intestine without perforation or abscess without bleeding: Secondary | ICD-10-CM

## 2018-12-14 DIAGNOSIS — Z23 Encounter for immunization: Secondary | ICD-10-CM | POA: Diagnosis not present

## 2018-12-14 DIAGNOSIS — Z87891 Personal history of nicotine dependence: Secondary | ICD-10-CM

## 2018-12-14 DIAGNOSIS — Z0000 Encounter for general adult medical examination without abnormal findings: Secondary | ICD-10-CM

## 2018-12-14 LAB — POCT URINALYSIS DIP (PROADVANTAGE DEVICE)
Bilirubin, UA: NEGATIVE
Blood, UA: NEGATIVE
Glucose, UA: NEGATIVE mg/dL
Ketones, POC UA: NEGATIVE mg/dL
Leukocytes, UA: NEGATIVE
Nitrite, UA: NEGATIVE
Protein Ur, POC: NEGATIVE mg/dL
Specific Gravity, Urine: 1.01
Urobilinogen, Ur: 0.2
pH, UA: 6 (ref 5.0–8.0)

## 2018-12-14 NOTE — Progress Notes (Signed)
Curtis Walker is a 63 y.o. male who presents for welcome to Medicare visit,CPE and follow-up on chronic medical conditions.  He has no particular concerns or complaints.  He does have underlying allergies which seem to be under good control.  He also has history of hyperlipidemia but is presently not on any medications.  He has had recent follow-up on adenomatous colonic polyps as well as diverticulosis.  Does use sildenafil for occasional difficulty with erectile dysfunction.  He has had difficulty with Peyronie's disease in the past but did not follow-up with urology.  He is a former smoker.  He has been a long-term relationship but does want STD testing as he is not totally monogamous.  Immunizations and Health Maintenance Immunization History  Administered Date(s) Administered  . DTaP 07/28/2001  . Influenza Split 01/08/2011  . Influenza Whole 01/01/2010  . Influenza, Seasonal, Injecte, Preservative Fre 01/17/2012  . Influenza,inj,Quad PF,6+ Mos 01/18/2013, 12/28/2013, 01/03/2015, 01/16/2016, 01/16/2017, 01/19/2018  . Tdap 01/08/2011  . Zoster 12/28/2013  . Zoster Recombinat (Shingrix) 01/16/2017, 03/20/2017   Health Maintenance Due  Topic Date Due  . INFLUENZA VACCINE  09/12/2018  . PNA vac Low Risk Adult (1 of 2 - PCV13) 10/14/2018    Last colonoscopy:10/20 [facility] Last PSA: Dentist:[facility] Ophtho:? Exercise:walk 2 miles daily  Other doctors caring for patient include:Dr Wall Advanced Directives:NO ,Info given    Depression screen:  See questionnaire below.     Depression screen [facility] 2/9 01/19/2018 01/16/2017 01/16/2016 01/03/2015 01/18/2013  Decreased Interest 0 0 0 0 0  Down, Depressed, Hopeless 0 0 0 0 0  PHQ - 2 Score 0 0 0 0 0    Fall Screen: See Questionaire below.   Fall Risk  01/16/2017 01/16/2016 01/03/2015 01/18/2013  Falls in the past year? No No No No    ADL screen:  See questionnaire below.  Functional Status Survey:     Review of  Systems  Constitutional: -, -unexpected weight change, -anorexia, -fatigue Allergy: -sneezing, -itching, -congestion Dermatology: denies changing moles, rash, lumps ENT: -runny nose, -ear pain, -sore throat,  Cardiology:  -chest pain, -palpitations, -orthopnea, Respiratory: -cough, -shortness of breath, -dyspnea on exertion, -wheezing,  Gastroenterology: -abdominal pain, -nausea, -vomiting, -diarrhea, -constipation, -dysphagia Hematology: -bleeding or bruising problems Musculoskeletal: -arthralgias, -myalgias, -joint swelling, -back pain, - Ophthalmology: -vision changes,  Urology: -dysuria, -difficulty urinating,  -urinary frequency, -urgency, incontinence Neurology: -, -numbness, , -memory loss, -falls, -dizziness    PHYSICAL EXAM:    General Appearance: Alert, cooperative, no distress, appears stated age Head: Normocephalic, without obvious abnormality, atraumatic Eyes: PERRL, conjunctiva/corneas clear, EOM's intact, fundi benign Ears: Normal TM's and external ear canals Nose: Nares normal, mucosa normal, no drainage or sinus   tenderness Throat: Lips, mucosa, and tongue normal; teeth and gums normal Neck: Supple, no lymphadenopathy, thyroid:no enlargement/tenderness/nodules; no carotid bruit or JVD Lungs: Clear to auscultation bilaterally without wheezes, rales or ronchi; respirations unlabored Heart: Regular rate and rhythm, S1 and S2 normal, no murmur, rub or gallop Abdomen: Soft, non-tender, nondistended, normoactive bowel sounds, no masses, no hepatosplenomegaly Extremities: No clubbing, cyanosis or edema Skin: Skin color, texture, turgor normal, no rashes or lesions Lymph nodes: Cervical, supraclavicular, and axillary nodes normal Neurologic: CNII-XII intact, normal strength, sensation and gait; reflexes 2+ and symmetric throughout   Psych: Normal mood, affect, hygiene and grooming  ASSESSMENT/PLAN: Routine general medical examination at a health care facility - Plan:  CBC with Differential/Platelet, Comprehensive metabolic panel, Lipid panel  Seasonal allergic rhinitis due to pollen  Hyperlipidemia  LDL goal <100 - Plan: Lipid panel  History of colonic polyps  Former smoker, stopped smoking in distant past  Erectile dysfunction, unspecified erectile dysfunction type  Diverticula of colon  Borderline hypertension  Contact with or exposure to communicable disease - Plan: RPR+HIV+GC+CT Panel  Peyronie's disease  Need for vaccination against Streptococcus pneumoniae - Plan: Pneumococcal conjugate vaccine 13-valent  Need for influenza vaccination - Plan: Flu Vaccine QUAD High Dose(Fluad)  Screening for AAA (abdominal aortic aneurysm) - Plan: US AORTA   Immunization recommendations discussed.  Colonoscopy recommendations reviewed.  Discussed going back to the urology for the Peyronie's disease.  Also discussed using PREP   Medicare Attestation I have personally reviewed: The patient's medical and social history Their use of alcohol, tobacco or illicit drugs Their current medications and supplements The patient's functional ability including ADLs,fall risks, home safety risks, cognitive, and hearing and visual impairment Diet and physical activities Evidence for depression or mood disorders  The patient's weight, height, and BMI have been recorded in the chart.  I have made referrals, counseling, and provided education to the patient based on review of the above and I have provided the patient with a written personalized care plan for preventive services.     Jill Alexanders, MD   12/14/2018

## 2018-12-14 NOTE — Patient Instructions (Signed)
  Curtis Walker , Thank you for taking time to come for your Medicare Wellness Visit. I appreciate your ongoing commitment to your health goals. Please review the following plan we discussed and let me know if I can assist you in the future.   These are the goals we discussed: Let me know when you want a refill on your sildenafil.  Follow-up with urology if you want to pursue the Peyronie's disease.  Continuing all your present medications.  Consider starting on prep. This is a list of the screening recommended for you and due dates:  Health Maintenance  Topic Date Due  . Flu Shot  09/12/2018  . Pneumonia vaccines (1 of 2 - PCV13) 10/14/2018  . Tetanus Vaccine  01/07/2021  . Colon Cancer Screening  11/24/2023  .  Hepatitis C: One time screening is recommended by Center for Disease Control  (CDC) for  adults born from [birth year] through 1967.   Completed  . HIV Screening  Completed

## 2018-12-15 LAB — COMPREHENSIVE METABOLIC PANEL
ALT: 51 IU/L — ABNORMAL HIGH (ref 0–44)
AST: 37 IU/L (ref 0–40)
Albumin/Globulin Ratio: 1.8 (ref 1.2–2.2)
Albumin: 4.8 g/dL (ref 3.8–4.8)
Alkaline Phosphatase: 93 IU/L (ref 39–117)
BUN/Creatinine Ratio: 15 (ref 10–24)
BUN: 19 mg/dL (ref 8–27)
Bilirubin Total: 0.6 mg/dL (ref 0.0–1.2)
CO2: 24 mmol/L (ref 20–29)
Calcium: 9.5 mg/dL (ref 8.6–10.2)
Chloride: 102 mmol/L (ref 96–106)
Creatinine, Ser: 1.31 mg/dL — ABNORMAL HIGH (ref 0.76–1.27)
GFR calc Af Amer: 66 mL/min/{1.73_m2} (ref >59)
GFR calc non Af Amer: 57 mL/min/{1.73_m2} — ABNORMAL LOW (ref >59)
Globulin, Total: 2.6 g/dL (ref 1.5–4.5)
Glucose: 94 mg/dL (ref 65–99)
Potassium: 4.3 mmol/L (ref 3.5–5.2)
Sodium: 140 mmol/L (ref 134–144)
Total Protein: 7.4 g/dL (ref 6.0–8.5)

## 2018-12-15 LAB — CBC WITH DIFFERENTIAL/PLATELET
Basophils Absolute: 0.1 10*3/uL (ref 0.0–0.2)
Basos: 1 %
EOS (ABSOLUTE): 0.1 10*3/uL (ref 0.0–0.4)
Eos: 1 %
Hematocrit: 44.8 % (ref 37.5–51.0)
Hemoglobin: 15.2 g/dL (ref 13.0–17.7)
Immature Grans (Abs): 0 10*3/uL (ref 0.0–0.1)
Immature Granulocytes: 0 %
Lymphocytes Absolute: 2.6 10*3/uL (ref 0.7–3.1)
Lymphs: 28 %
MCH: 29.5 pg (ref 26.6–33.0)
MCHC: 33.9 g/dL (ref 31.5–35.7)
MCV: 87 fL (ref 79–97)
Monocytes Absolute: 0.5 10*3/uL (ref 0.1–0.9)
Monocytes: 5 %
Neutrophils Absolute: 5.8 10*3/uL (ref 1.4–7.0)
Neutrophils: 65 %
Platelets: 319 10*3/uL (ref 150–450)
RBC: 5.15 x10E6/uL (ref 4.14–5.80)
RDW: 13.3 % (ref 11.6–15.4)
WBC: 9.1 10*3/uL (ref 3.4–10.8)

## 2018-12-15 LAB — LIPID PANEL
Chol/HDL Ratio: 6.2 ratio — ABNORMAL HIGH (ref 0.0–5.0)
Cholesterol, Total: 231 mg/dL — ABNORMAL HIGH (ref 100–199)
HDL: 37 mg/dL — ABNORMAL LOW (ref >39)
LDL Chol Calc (NIH): 152 mg/dL — ABNORMAL HIGH (ref 0–99)
Triglycerides: 227 mg/dL — ABNORMAL HIGH (ref 0–149)
VLDL Cholesterol Cal: 42 mg/dL — ABNORMAL HIGH (ref 5–40)

## 2018-12-15 LAB — RPR+HIV+GC+CT PANEL
Chlamydia trachomatis, NAA: NEGATIVE
HIV Screen 4th Generation wRfx: NONREACTIVE
Neisseria Gonorrhoeae by PCR: NEGATIVE
RPR Ser Ql: NONREACTIVE

## 2018-12-28 ENCOUNTER — Ambulatory Visit
Admission: RE | Admit: 2018-12-28 | Discharge: 2018-12-28 | Disposition: A | Discharge status: Home / Self Care | Payer: PPO | Source: Ambulatory Visit | Attending: Family Medicine | Admitting: Family Medicine

## 2018-12-28 ENCOUNTER — Other Ambulatory Visit: Payer: Self-pay

## 2018-12-28 DIAGNOSIS — Z87891 Personal history of nicotine dependence: Secondary | ICD-10-CM | POA: Diagnosis not present

## 2018-12-28 DIAGNOSIS — Z136 Encounter for screening for cardiovascular disorders: Secondary | ICD-10-CM | POA: Diagnosis not present

## 2019-02-28 ENCOUNTER — Encounter: Payer: Self-pay | Admitting: Family Medicine

## 2019-09-07 ENCOUNTER — Other Ambulatory Visit: Payer: Self-pay

## 2019-12-15 ENCOUNTER — Ambulatory Visit (INDEPENDENT_AMBULATORY_CARE_PROVIDER_SITE_OTHER): Payer: PPO | Admitting: Family Medicine

## 2019-12-15 ENCOUNTER — Encounter: Payer: Self-pay | Admitting: Family Medicine

## 2019-12-15 ENCOUNTER — Other Ambulatory Visit: Payer: Self-pay

## 2019-12-15 ENCOUNTER — Ambulatory Visit: Payer: PPO | Admitting: Family Medicine

## 2019-12-15 VITALS — BP 140/86 | HR 69 | Temp 97.4°F | Ht 74.75 in | Wt 209.4 lb

## 2019-12-15 DIAGNOSIS — Z23 Encounter for immunization: Secondary | ICD-10-CM | POA: Diagnosis not present

## 2019-12-15 DIAGNOSIS — N529 Male erectile dysfunction, unspecified: Secondary | ICD-10-CM | POA: Diagnosis not present

## 2019-12-15 DIAGNOSIS — Z0000 Encounter for general adult medical examination without abnormal findings: Secondary | ICD-10-CM | POA: Diagnosis not present

## 2019-12-15 DIAGNOSIS — K635 Polyp of colon: Secondary | ICD-10-CM | POA: Diagnosis not present

## 2019-12-15 DIAGNOSIS — Z86010 Personal history of colonic polyps: Secondary | ICD-10-CM

## 2019-12-15 DIAGNOSIS — J301 Allergic rhinitis due to pollen: Secondary | ICD-10-CM

## 2019-12-15 DIAGNOSIS — I10 Essential (primary) hypertension: Secondary | ICD-10-CM

## 2019-12-15 DIAGNOSIS — N486 Induration penis plastica: Secondary | ICD-10-CM | POA: Diagnosis not present

## 2019-12-15 DIAGNOSIS — Z209 Contact with and (suspected) exposure to unspecified communicable disease: Secondary | ICD-10-CM

## 2019-12-15 DIAGNOSIS — E785 Hyperlipidemia, unspecified: Secondary | ICD-10-CM | POA: Diagnosis not present

## 2019-12-15 LAB — LIPID PANEL

## 2019-12-15 MED ORDER — SILDENAFIL CITRATE 20 MG PO TABS: ORAL_TABLET | ORAL | 5 refills | Status: AC

## 2019-12-15 MED ORDER — LOSARTAN POTASSIUM 50 MG PO TABS: 50.0000 mg | ORAL_TABLET | Freq: Every day | ORAL | 3 refills | Status: DC

## 2019-12-15 NOTE — Patient Instructions (Signed)
  Mr. Fitzner , Thank you for taking time to come for your Medicare Wellness Visit. I appreciate your ongoing commitment to your health goals. Please review the following plan we discussed and let me know if I can assist you in the future.   These are the goals we discussed: Return here in 1 month for recheck on your blood pressure this is a list of the screening recommended for you and due dates:  Health Maintenance  Topic Date Due  . Flu Shot  09/12/2019  . Pneumonia vaccines (2 of 2 - PCV13) 12/14/2019  . COVID-19 Vaccine (2 - Moderna 2-dose series) 01/07/2020  . Tetanus Vaccine  01/07/2021  . Colon Cancer Screening  11/24/2023  .  Hepatitis C: One time screening is recommended by Center for Disease Control  (CDC) for  adults born from [birth year] through 1967.   Completed

## 2019-12-15 NOTE — Progress Notes (Signed)
Curtis Walker is a 64 y.o. male who presents for annual wellness visit,CPE and follow-up on chronic medical conditions.  He has no particular concerns or complaints other than occasional difficulty with wife because an internal hemorrhoid that occasionally causes some discomfort.  He does have a history of colonic polyps and is scheduled for routine follow-up on that.  He does have some slight reflux disease and does use omeprazole on an as-needed basis.  He would like a refill on his sildenafil.  His allergies seem to be under good control.  He uses over-the-counter medications he has a history of Peyronie's disease but has not followed up on it.  Apparently does not cause much of a problem.  He is now retired.   Immunizations and Health Maintenance Immunization History  Administered Date(s) Administered  . DTaP 07/28/2001  . Fluad Quad(high Dose 65+) 12/14/2018  . Influenza Split 01/08/2011  . Influenza Whole 01/01/2010  . Influenza, Seasonal, Injecte, Preservative Fre 01/17/2012  . Influenza,inj,Quad PF,6+ Mos 01/18/2013, 12/28/2013, 01/03/2015, 01/16/2016, 01/16/2017, 01/19/2018  . Moderna SARS-COVID-2 Vaccination 12/10/2019  . Pneumococcal Polysaccharide-23 12/14/2018  . Tdap 01/08/2011  . Zoster 12/28/2013  . Zoster Recombinat (Shingrix) 01/16/2017, 03/20/2017   Health Maintenance Due  Topic Date Due  . INFLUENZA VACCINE  09/12/2019  . PNA vac Low Risk Adult (2 of 2 - PCV13) 12/14/2019    Last colonoscopy:2020 Last PSA:2017 Dentist: Ophtho: Exercise: Walk 2 miles with her dog Other doctors caring for patient include:  Advanced Directives: Does Patient Have a Medical Advance Directive?: No Would patient like information on creating a medical advance directive?: Yes (ED - Information included in AVS)  Depression screen:  See questionnaire below.     Depression screen [facility] 2/9 12/15/2019 12/14/2018 01/19/2018 01/16/2017 01/16/2016  Decreased Interest 0 0 0 0 0  Down,  Depressed, Hopeless 0 0 0 0 0  PHQ - 2 Score 0 0 0 0 0    Fall Screen: See Questionaire below.   Fall Risk  12/15/2019 09/07/2019 12/14/2018 01/16/2017 01/16/2016  Falls in the past year? 0 0 0 No No  Comment - Emmi Telephone Survey: data to providers prior to load - - -  Risk for fall due to : No Fall Risks - - - -    ADL screen:  See questionnaire below.  Functional Status Survey: Is the patient deaf or have difficulty hearing?: No Does the patient have difficulty seeing, even when wearing glasses/contacts?: No Does the patient have difficulty concentrating, remembering, or making decisions?: No Does the patient have difficulty walking or climbing stairs?: No Does the patient have difficulty dressing or bathing?: No Does the patient have difficulty doing errands alone such as visiting a doctor's office or shopping?: No   Review of Systems  Constitutional: -, -unexpected weight change, -anorexia, -fatigue Allergy: -sneezing, -itching, -congestion Dermatology: denies changing moles, rash, lumps ENT: -runny nose, -ear pain, -sore throat,  Cardiology:  -chest pain, -palpitations, -orthopnea, Respiratory: -cough, -shortness of breath, -dyspnea on exertion, -wheezing,  Gastroenterology: -abdominal pain, -nausea, -vomiting, -diarrhea, -constipation, -dysphagia Hematology: -bleeding or bruising problems Musculoskeletal: -arthralgias, -myalgias, -joint swelling, -back pain, - Ophthalmology: -vision changes,  Urology: -dysuria, -difficulty urinating,  -urinary frequency, -urgency, incontinence Neurology: -, -numbness, , -memory loss, -falls, -dizziness    PHYSICAL EXAM:   General Appearance: Alert, cooperative, no distress, appears stated age Head: Normocephalic, without obvious abnormality, atraumatic Eyes: PERRL, conjunctiva/corneas clear, EOM's intact,  Ears: Normal TM's and external ear canals Nose: Nares normal, mucosa normal, no  drainage or sinus   tenderness Throat: Lips,  mucosa, and tongue normal; teeth and gums normal Neck: Supple, no lymphadenopathy, thyroid:no enlargement/tenderness/nodules; no carotid bruit or JVD Lungs: Clear to auscultation bilaterally without wheezes, rales or ronchi; respirations unlabored Heart: Regular rate and rhythm, S1 and S2 normal, no murmur, rub or gallop Abdomen: Soft, non-tender, nondistended, normoactive bowel sounds, no masses, no hepatosplenomegaly Extremities: No clubbing, cyanosis or edema Pulses: 2+ and symmetric all extremities Skin: Skin color, texture, turgor normal, no rashes or lesions Lymph nodes: Cervical, supraclavicular, and axillary nodes normal Neurologic: CNII-XII intact, normal strength, sensation and gait; reflexes 2+ and symmetric throughout   Psych: Normal mood, affect, hygiene and grooming  ASSESSMENT/PLAN: Routine general medical examination at a health care facility - Plan: Comprehensive metabolic panel, CBC with Differential/Platelet, Lipid panel  Seasonal allergic rhinitis due to pollen: Treat with OTC medications  Hyperlipidemia LDL goal <100 - Plan: Lipid panel  Erectile dysfunction, unspecified erectile dysfunction type - Plan: sildenafil (REVATIO) 20 MG tablet  Need for influenza vaccination - Plan: Flu Vaccine QUAD High Dose(Fluad)  Contact with or exposure to communicable disease - Plan: RPR+HIV+GC+CT Panel  Peyronie's disease: He is to call when he needs a referral. History of colonic polyps: Follow-up as planned.    Essential hypertension - Plan: losartan (COZAAR) 50 MG tablet: Return in 1 month for blood pressure recheck  Polyp of sigmoid colon, unspecified type: If he continues have trouble, he is to call for referral to GI. I explained that I reviewed his medical record concerning his blood pressure and with the new diagnostic criteria, treating his pressure at this point would be appropriate.    recommended at least 30 minutes of aerobic activity at least 5 days/week;  Immunization recommendations discussed.  Colonoscopy recommendations reviewed.   Medicare Attestation I have personally reviewed: The patient's medical and social history Their use of alcohol, tobacco or illicit drugs Their current medications and supplements The patient's functional ability including ADLs,fall risks, home safety risks, cognitive, and hearing and visual impairment Diet and physical activities Evidence for depression or mood disorders  The patient's weight, height, and BMI have been recorded in the chart.  I have made referrals, counseling, and provided education to the patient based on review of the above and I have provided the patient with a written personalized care plan for preventive services.     Jill Alexanders, MD   12/15/2019

## 2019-12-17 LAB — RPR+HIV+GC+CT PANEL
Chlamydia trachomatis, NAA: NEGATIVE
HIV Screen 4th Generation wRfx: NONREACTIVE
Neisseria Gonorrhoeae by PCR: NEGATIVE
RPR Ser Ql: NONREACTIVE

## 2019-12-17 LAB — COMPREHENSIVE METABOLIC PANEL
ALT: 45 IU/L — ABNORMAL HIGH (ref 0–44)
AST: 37 IU/L (ref 0–40)
Albumin/Globulin Ratio: 1.8 (ref 1.2–2.2)
Albumin: 4.9 g/dL — ABNORMAL HIGH (ref 3.8–4.8)
Alkaline Phosphatase: 99 IU/L (ref 44–121)
BUN/Creatinine Ratio: 17 (ref 10–24)
BUN: 22 mg/dL (ref 8–27)
Bilirubin Total: 0.7 mg/dL (ref 0.0–1.2)
CO2: 25 mmol/L (ref 20–29)
Calcium: 9.9 mg/dL (ref 8.6–10.2)
Chloride: 103 mmol/L (ref 96–106)
Creatinine, Ser: 1.33 mg/dL — ABNORMAL HIGH (ref 0.76–1.27)
GFR calc Af Amer: 64 mL/min/{1.73_m2} (ref >59)
GFR calc non Af Amer: 55 mL/min/{1.73_m2} — ABNORMAL LOW (ref >59)
Globulin, Total: 2.8 g/dL (ref 1.5–4.5)
Glucose: 98 mg/dL (ref 65–99)
Potassium: 4.6 mmol/L (ref 3.5–5.2)
Sodium: 142 mmol/L (ref 134–144)
Total Protein: 7.7 g/dL (ref 6.0–8.5)

## 2019-12-17 LAB — CBC WITH DIFFERENTIAL/PLATELET
Basophils Absolute: 0.1 10*3/uL (ref 0.0–0.2)
Basos: 1 %
EOS (ABSOLUTE): 0.1 10*3/uL (ref 0.0–0.4)
Eos: 1 %
Hematocrit: 47.1 % (ref 37.5–51.0)
Hemoglobin: 15.7 g/dL (ref 13.0–17.7)
Immature Grans (Abs): 0.1 10*3/uL (ref 0.0–0.1)
Immature Granulocytes: 1 %
Lymphocytes Absolute: 3 10*3/uL (ref 0.7–3.1)
Lymphs: 31 %
MCH: 30.1 pg (ref 26.6–33.0)
MCHC: 33.3 g/dL (ref 31.5–35.7)
MCV: 90 fL (ref 79–97)
Monocytes Absolute: 0.5 10*3/uL (ref 0.1–0.9)
Monocytes: 5 %
Neutrophils Absolute: 6.1 10*3/uL (ref 1.4–7.0)
Neutrophils: 61 %
Platelets: 294 10*3/uL (ref 150–450)
RBC: 5.22 x10E6/uL (ref 4.14–5.80)
RDW: 12.2 % (ref 11.6–15.4)
WBC: 9.9 10*3/uL (ref 3.4–10.8)

## 2019-12-17 LAB — LIPID PANEL
Chol/HDL Ratio: 6.2 ratio — ABNORMAL HIGH (ref 0.0–5.0)
Cholesterol, Total: 256 mg/dL — ABNORMAL HIGH (ref 100–199)
HDL: 41 mg/dL (ref >39)
LDL Chol Calc (NIH): 179 mg/dL — ABNORMAL HIGH (ref 0–99)
Triglycerides: 194 mg/dL — ABNORMAL HIGH (ref 0–149)
VLDL Cholesterol Cal: 36 mg/dL (ref 5–40)

## 2019-12-27 ENCOUNTER — Encounter: Payer: Self-pay | Admitting: Family Medicine

## 2020-01-13 ENCOUNTER — Ambulatory Visit (INDEPENDENT_AMBULATORY_CARE_PROVIDER_SITE_OTHER): Payer: PPO | Admitting: Family Medicine

## 2020-01-13 ENCOUNTER — Encounter: Payer: Self-pay | Admitting: Family Medicine

## 2020-01-13 ENCOUNTER — Other Ambulatory Visit: Payer: Self-pay

## 2020-01-13 VITALS — BP 130/78 | HR 74 | Temp 98.0°F | Wt 212.6 lb

## 2020-01-13 DIAGNOSIS — I10 Essential (primary) hypertension: Secondary | ICD-10-CM | POA: Diagnosis not present

## 2020-01-13 DIAGNOSIS — Z23 Encounter for immunization: Secondary | ICD-10-CM | POA: Diagnosis not present

## 2020-01-13 NOTE — Progress Notes (Signed)
   Subjective:    Patient ID: Curtis Walker, male    DOB: 22-Apr-1955, 64 y.o.   MRN: [id number]  HPI He is here for recheck on his blood pressure.  He is now taking losartan has had no cough, shortness of breath, itching.  Review of record indicates the need for another pneumonia shot.   Review of Systems     Objective:   Physical Exam Alert and in no distress.  Blood pressure is recorded.       Assessment & Plan:  Need for vaccination against Streptococcus pneumoniae - Plan: Pneumococcal conjugate vaccine 13-valent  Essential hypertension Continue on the losartan.  Follow-up 1 year.

## 2020-02-12 IMAGING — US US ABDOMINAL AORTA SCREENING AAA
1 series · 14 of 16 positions shown · non-contrast
Comparison: None.

CLINICAL DATA: Male between 65-75 years of age with a smoking
history.

EXAM:
US ABDOMINAL AORTA MEDICARE SCREENING
TECHNIQUE: Ultrasound examination of the abdominal aorta was performed as a
screening evaluation for abdominal aortic aneurysm.

[Series 1: us abdominal aorta screening aaa · 14 of 16 slices shown]
[im 1/16]
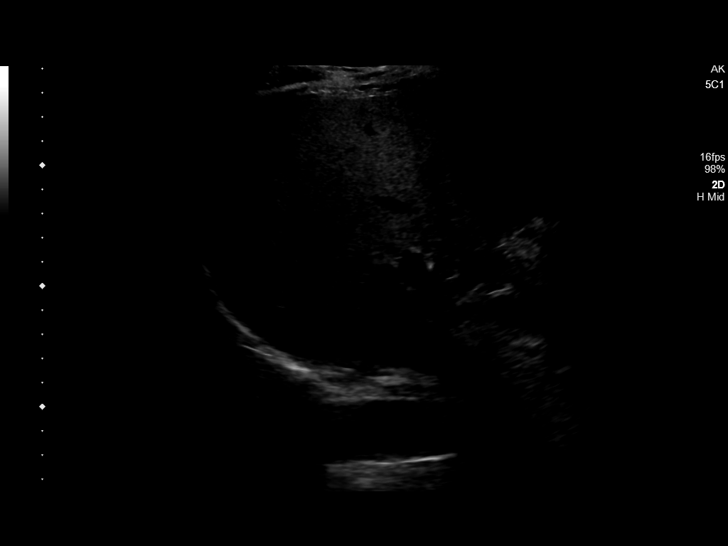
[im 2/16]
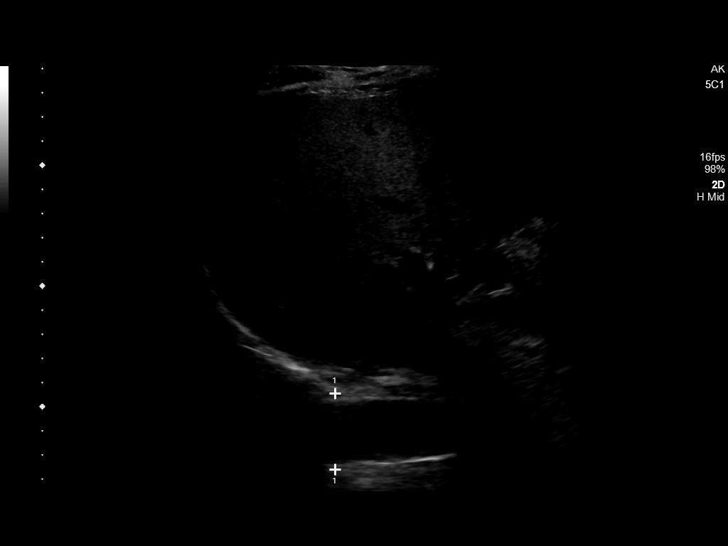
[im 3/16]
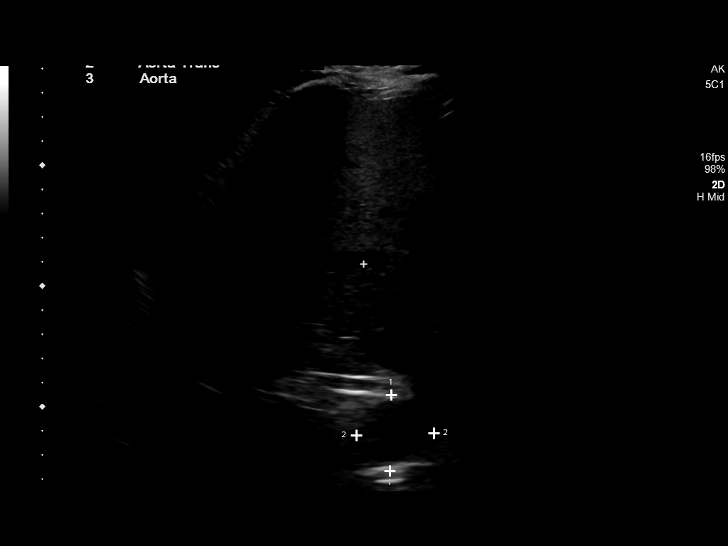
[im 5/16]
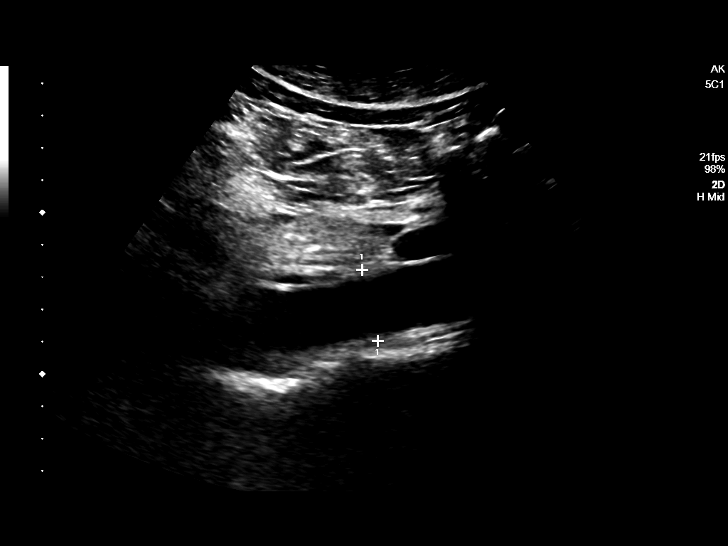
[im 6/16]
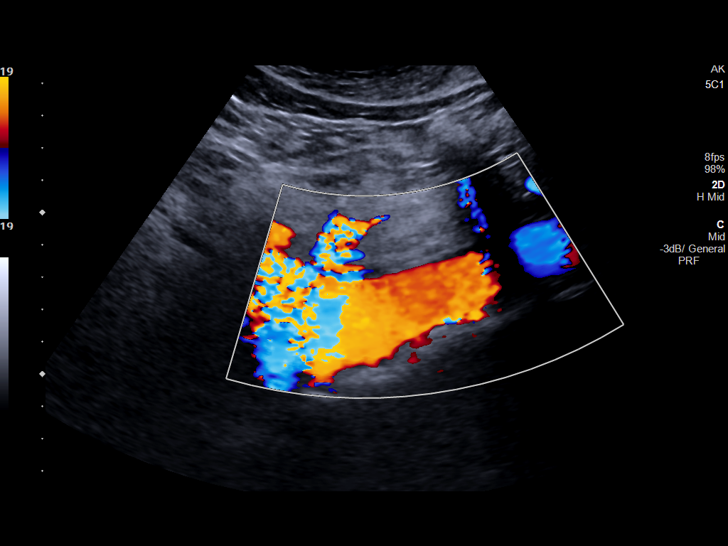
[im 7/16]
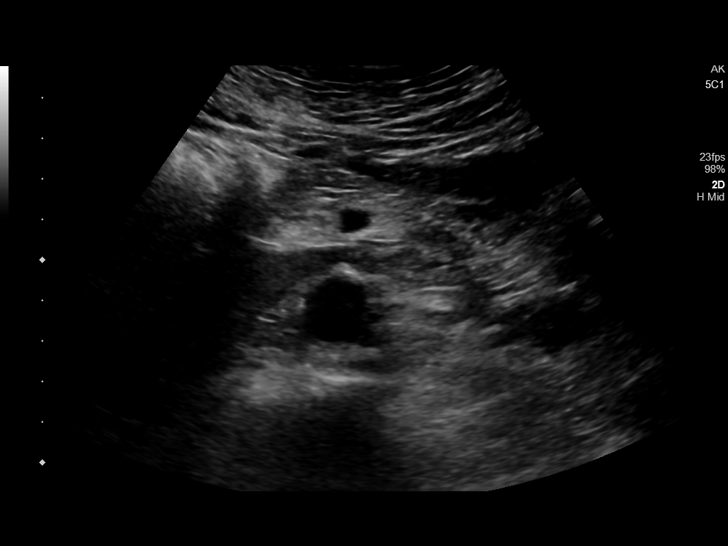
[im 8/16]
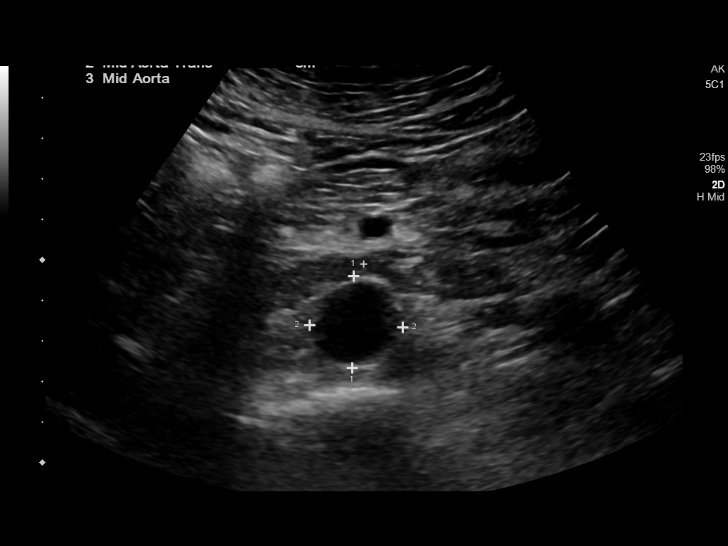
[im 9/16]
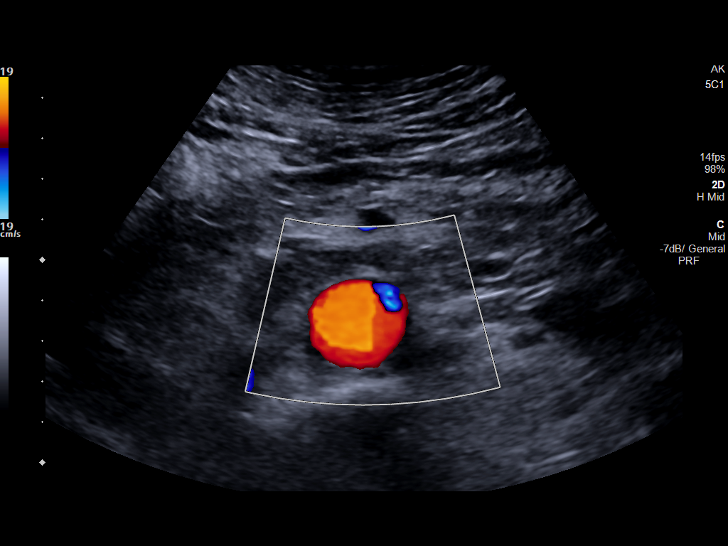
[im 10/16]
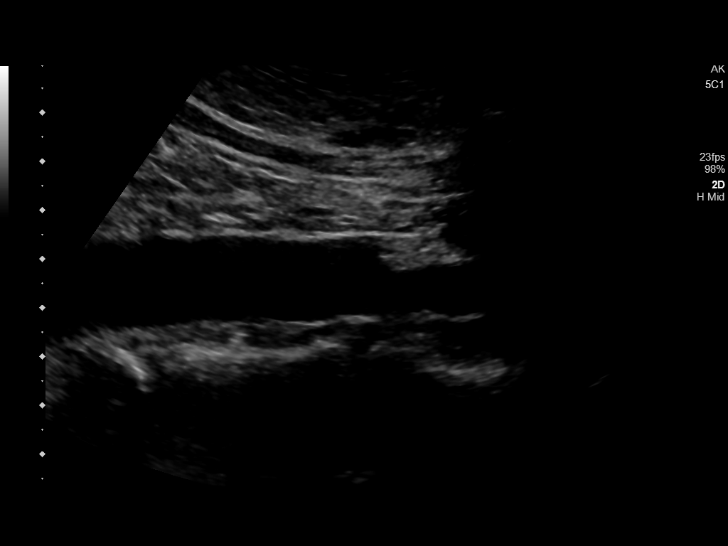
[im 11/16]
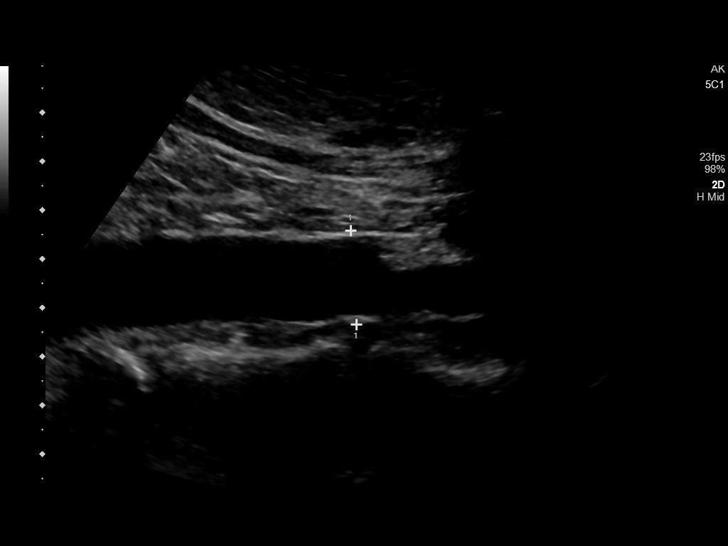
[im 13/16]
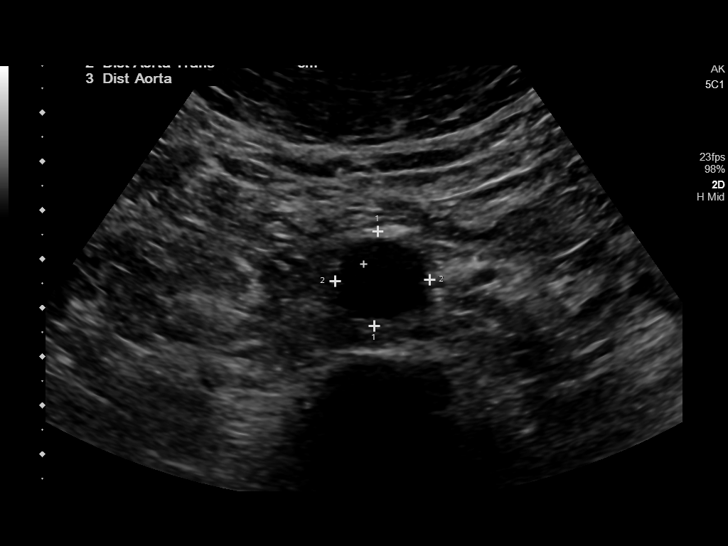
[im 14/16]
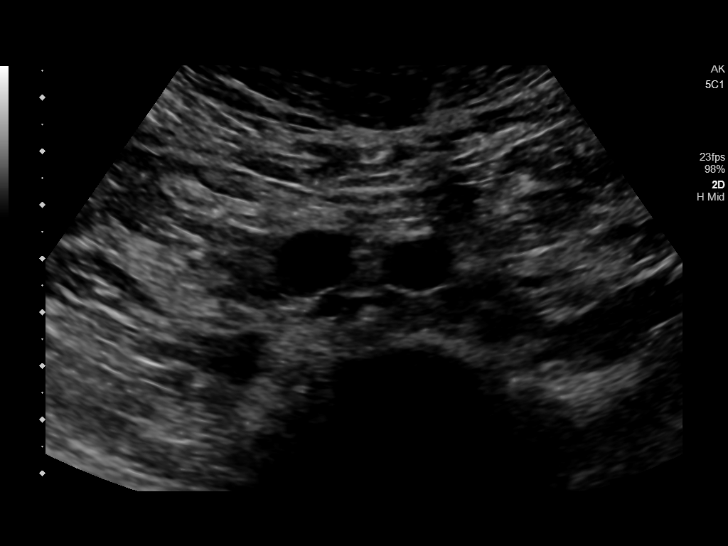
[im 15/16]
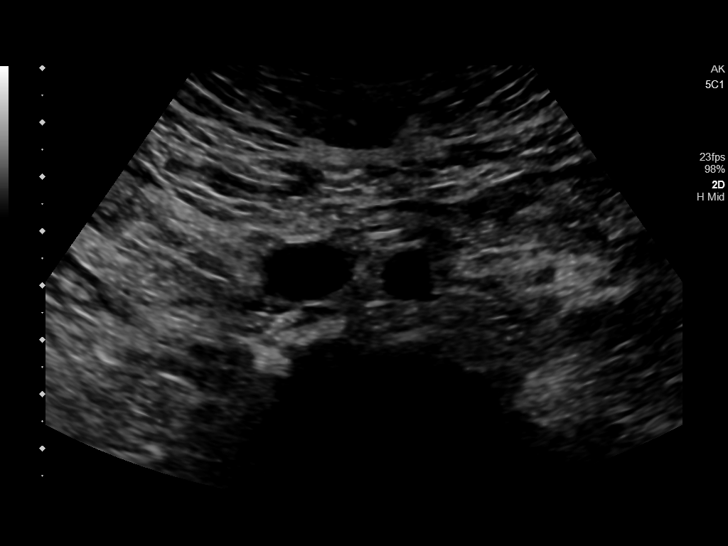
[im 16/16]
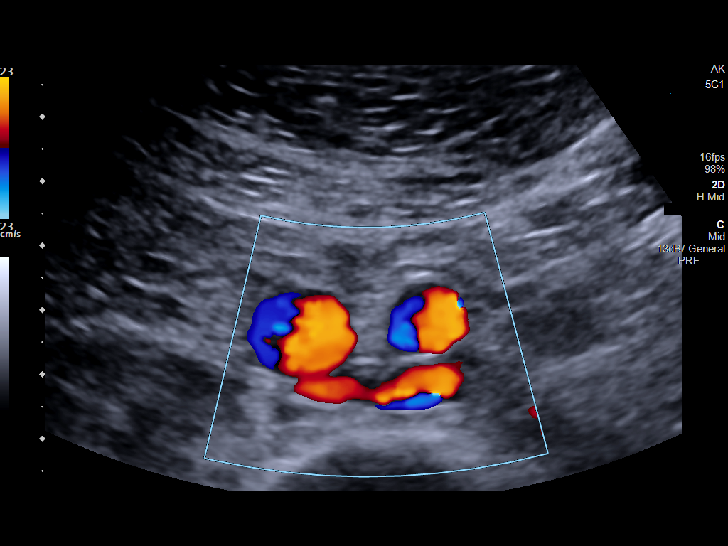

[14 of 16 positions shown; findings below may reference images not displayed]

FINDINGS: Abdominal aortic measurements as follows:

Proximal:  2.9 cm

Mid:  2.3 cm

Distal:  1.9 cm
IMPRESSION: Negative for abdominal aortic aneurysm.

## 2020-04-03 DIAGNOSIS — D225 Melanocytic nevi of trunk: Secondary | ICD-10-CM | POA: Diagnosis not present

## 2020-04-03 DIAGNOSIS — D2262 Melanocytic nevi of left upper limb, including shoulder: Secondary | ICD-10-CM | POA: Diagnosis not present

## 2020-04-03 DIAGNOSIS — L821 Other seborrheic keratosis: Secondary | ICD-10-CM | POA: Diagnosis not present

## 2020-04-03 DIAGNOSIS — D2261 Melanocytic nevi of right upper limb, including shoulder: Secondary | ICD-10-CM | POA: Diagnosis not present

## 2020-04-03 DIAGNOSIS — D2271 Melanocytic nevi of right lower limb, including hip: Secondary | ICD-10-CM | POA: Diagnosis not present

## 2020-04-03 DIAGNOSIS — D2272 Melanocytic nevi of left lower limb, including hip: Secondary | ICD-10-CM | POA: Diagnosis not present

## 2020-11-24 DIAGNOSIS — Z23 Encounter for immunization: Secondary | ICD-10-CM | POA: Diagnosis not present

## 2020-12-15 ENCOUNTER — Encounter: Payer: Self-pay | Admitting: Family Medicine

## 2020-12-15 DIAGNOSIS — I10 Essential (primary) hypertension: Secondary | ICD-10-CM

## 2020-12-15 MED ORDER — LOSARTAN POTASSIUM 50 MG PO TABS: 50.0000 mg | ORAL_TABLET | Freq: Every day | ORAL | 0 refills | Status: DC

## 2021-02-15 ENCOUNTER — Other Ambulatory Visit: Payer: Self-pay

## 2021-02-15 ENCOUNTER — Ambulatory Visit (INDEPENDENT_AMBULATORY_CARE_PROVIDER_SITE_OTHER): Payer: PPO | Admitting: Family Medicine

## 2021-02-15 ENCOUNTER — Encounter: Payer: Self-pay | Admitting: Family Medicine

## 2021-02-15 VITALS — BP 122/76 | HR 71 | Temp 97.7°F | Ht 74.25 in | Wt 213.8 lb

## 2021-02-15 DIAGNOSIS — Z86010 Personal history of colonic polyps: Secondary | ICD-10-CM | POA: Diagnosis not present

## 2021-02-15 DIAGNOSIS — E785 Hyperlipidemia, unspecified: Secondary | ICD-10-CM | POA: Diagnosis not present

## 2021-02-15 DIAGNOSIS — N529 Male erectile dysfunction, unspecified: Secondary | ICD-10-CM | POA: Diagnosis not present

## 2021-02-15 DIAGNOSIS — J301 Allergic rhinitis due to pollen: Secondary | ICD-10-CM | POA: Diagnosis not present

## 2021-02-15 DIAGNOSIS — I10 Essential (primary) hypertension: Secondary | ICD-10-CM | POA: Diagnosis not present

## 2021-02-15 DIAGNOSIS — Z0000 Encounter for general adult medical examination without abnormal findings: Secondary | ICD-10-CM | POA: Diagnosis not present

## 2021-02-15 DIAGNOSIS — Z209 Contact with and (suspected) exposure to unspecified communicable disease: Secondary | ICD-10-CM | POA: Diagnosis not present

## 2021-02-15 MED ORDER — LOSARTAN POTASSIUM 50 MG PO TABS: 50.0000 mg | ORAL_TABLET | Freq: Every day | ORAL | 3 refills | Status: DC

## 2021-02-15 NOTE — Progress Notes (Signed)
Curtis Walker is a 66 y.o. male who presents for annual wellness visit ,CPE and follow-up on chronic medical conditions.  He continues on his losartan and having no difficulty with that.  He does not need a refill on sildenafil at the present time.  His allergies seem to be under good control with the use of Flonase and Allegra.  He does have reflux symptoms that usually when he eats foods that he knows will cause difficulty.  He does have a history of colonic polyps and will need repeat colonoscopy in 2025.  He would like to have STD testing done to be safe.  He is apparently not had any recent sexual activity.  He is in a long-term relationship but they are not married.  Family and social history was reviewed.   Immunizations and Health Maintenance Immunization History  Administered Date(s) Administered   DTaP 07/28/2001   Fluad Quad(high Dose 65+) 12/14/2018, 12/15/2019   Influenza Split 01/08/2011   Influenza Whole 01/01/2010   Influenza, High Dose Seasonal PF 11/24/2020   Influenza, Seasonal, Injecte, Preservative Fre 01/17/2012   Influenza,inj,Quad PF,6+ Mos 01/18/2013, 12/28/2013, 01/03/2015, 01/16/2016, 01/16/2017, 01/19/2018   Moderna Sars-Covid-2 Vaccination 03/11/2019, 04/08/2019, 12/10/2019   Pneumococcal Conjugate-13 01/13/2020   Pneumococcal Polysaccharide-23 12/14/2018   Tdap 01/08/2011   Zoster Recombinat (Shingrix) 01/16/2017, 03/20/2017   Zoster, Live 12/28/2013   Health Maintenance Due  Topic Date Due   COVID-19 Vaccine (4 - Booster for Moderna series) 02/04/2020   TETANUS/TDAP  01/07/2021    Last colonoscopy:11/24/2018 Dr. Allen Norris Last PSA:01/16/2016 (0.5) Dentist: Q six months   Ophtho: over two year Exercise: walking five days a week  Other doctors caring for patient include: Dr. Allen Norris GI              Does Patient Have a Medical Advance Directive?: No Would patient l Advanced (ED - Information included in AVS)  Depression Yes Directives:ike information on  creating a medical advance directive?: screen:  See questionnaire below.     Depression screen [facility] 2/9 02/15/2021 12/15/2019 12/14/2018 01/19/2018 01/16/2017  Decreased Interest 0 0 0 0 0  Down, Depressed, Hopeless 0 0 0 0 0  PHQ - 2 Score 0 0 0 0 0    Fall Screen: See Questionaire below.   Fall Risk  02/15/2021 12/15/2019 09/07/2019 12/14/2018 01/16/2017  Falls in the past year? 0 0 0 0 No  Comment - - Emmi Telephone Survey: data to providers prior to load - -  Number falls in past yr: 0 - - - -  Injury with Fall? 0 - - - -  Risk for fall due to : No Fall Risks No Fall Risks - - -  Follow up Falls evaluation completed - - - -    ADL screen:  See questionnaire below.  Functional Status Survey: Is the patient deaf or have difficulty hearing?: No Does the patient have difficulty seeing, even when wearing glasses/contacts?: No Does the patient have difficulty concentrating, remembering, or making decisions?: No Does the patient have difficulty walking or climbing stairs?: No Does the patient have difficulty dressing or bathing?: No Does the patient have difficulty doing errands alone such as visiting a doctor's office or shopping?: No   Review of Systems  Constitutional: -, -unexpected weight change, -anorexia, -fatigue Allergy: -sneezing, -itching, -congestion Dermatology: denies changing moles, rash, lumps ENT: -runny nose, -ear pain, -sore throat,  Cardiology:  -chest pain, -palpitations, -orthopnea, Respiratory: -cough, -shortness of breath, -dyspnea on exertion, -wheezing,  Gastroenterology: -abdominal  pain, -nausea, -vomiting, -diarrhea, -constipation, -dysphagia Hematology: -bleeding or bruising problems Musculoskeletal: -arthralgias, -myalgias, -joint swelling, -back pain, - Ophthalmology: -vision changes,  Urology: -dysuria, -difficulty urinating,  -urinary frequency, -urgency, incontinence Neurology: -, -numbness, , -memory loss, -falls, -dizziness    PHYSICAL EXAM: General  Appearance: Alert, cooperative, no distress, appears stated age Head: Normocephalic, without obvious abnormality, atraumatic Eyes: PERRL, conjunctiva/corneas clear, EOM's intact,  Ears: Normal TM's and external ear canals Nose: Nares normal, mucosa normal, no drainage or sinus   tenderness Throat: Lips, mucosa, and tongue normal; teeth and gums normal Neck: Supple, no lymphadenopathy, thyroid:no enlargement/tenderness/nodules; no carotid bruit or JVD Lungs: Clear to auscultation bilaterally without wheezes, rales or ronchi; respirations unlabored Heart: Regular rate and rhythm, S1 and S2 normal, no murmur, rub or gallop Abdomen: Soft, non-tender, nondistended, normoactive bowel sounds, no masses, no hepatosplenomegaly Skin: Skin color, texture, turgor normal, no rashes or lesions Lymph nodes: Cervical, supraclavicular, and axillary nodes normal Neurologic: CNII-XII intact, normal strength, sensation and gait; reflexes 2+ and symmetric throughout   Psych: Normal mood, affect, hygiene and grooming EKG read by me shows a rhythm of 75, normal sinus rhythm with other parameters being normal. ASSESSMENT/PLAN: Routine general medical examination at a health care facility - Plan: CBC with Differential/Platelet, Comprehensive metabolic panel, Lipid panel  Essential hypertension - Plan: losartan (COZAAR) 50 MG tablet, EKG 12-Lead  Seasonal allergic rhinitis due to pollen - Plan: CBC with Differential/Platelet, Comprehensive metabolic panel  Hyperlipidemia LDL goal <100 - Plan: Lipid panel  Erectile dysfunction, unspecified erectile dysfunction type  History of colonic polyps  Contact with or exposure to communicable disease - Plan: RPR+HIV+GC+CT Panel Discussed the need for prep and monkey pox but at this time he is not interested. He will follow-up with colonoscopy. Treat the allergies as needed with Flonase and with Allegra.  Discussed the concept of tachyphylaxis with him. He will use a PPI  on an as-needed basis for his reflux. Recommend he get Tdap at the drugstore.   Discussed PSA screening (risks/benefits), recommended at least 30 minutes of aerobic activity at least 5 days/week; immunization recommendations discussed.  Colonoscopy recommendations reviewed.   Medicare Attestation I have personally reviewed: The patient's medical and social history Their use of alcohol, tobacco or illicit drugs Their current medications and supplements The patient's functional ability including ADLs,fall risks, home safety risks, cognitive, and hearing and visual impairment Diet and physical activities Evidence for depression or mood disorders  The patient's weight, height, and BMI have been recorded in the chart.  I have made referrals, counseling, and provided education to the patient based on review of the above and I have provided the patient with a written personalized care plan for preventive services.     Jill Alexanders, MD   02/15/2021

## 2021-02-17 LAB — CBC WITH DIFFERENTIAL/PLATELET
Basophils Absolute: 0.1 10*3/uL (ref 0.0–0.2)
Basos: 1 %
EOS (ABSOLUTE): 0.1 10*3/uL (ref 0.0–0.4)
Eos: 1 %
Hematocrit: 42.1 % (ref 37.5–51.0)
Hemoglobin: 15 g/dL (ref 13.0–17.7)
Immature Grans (Abs): 0 10*3/uL (ref 0.0–0.1)
Immature Granulocytes: 0 %
Lymphocytes Absolute: 2.3 10*3/uL (ref 0.7–3.1)
Lymphs: 25 %
MCH: 31.1 pg (ref 26.6–33.0)
MCHC: 35.6 g/dL (ref 31.5–35.7)
MCV: 87 fL (ref 79–97)
Monocytes Absolute: 0.4 10*3/uL (ref 0.1–0.9)
Monocytes: 4 %
Neutrophils Absolute: 6.3 10*3/uL (ref 1.4–7.0)
Neutrophils: 69 %
Platelets: 294 10*3/uL (ref 150–450)
RBC: 4.83 x10E6/uL (ref 4.14–5.80)
RDW: 12.6 % (ref 11.6–15.4)
WBC: 9.2 10*3/uL (ref 3.4–10.8)

## 2021-02-17 LAB — COMPREHENSIVE METABOLIC PANEL
ALT: 41 IU/L (ref 0–44)
AST: 33 IU/L (ref 0–40)
Albumin/Globulin Ratio: 1.9 (ref 1.2–2.2)
Albumin: 5 g/dL — ABNORMAL HIGH (ref 3.8–4.8)
Alkaline Phosphatase: 89 IU/L (ref 44–121)
BUN/Creatinine Ratio: 19 (ref 10–24)
BUN: 26 mg/dL (ref 8–27)
Bilirubin Total: 0.6 mg/dL (ref 0.0–1.2)
CO2: 24 mmol/L (ref 20–29)
Calcium: 9.5 mg/dL (ref 8.6–10.2)
Chloride: 103 mmol/L (ref 96–106)
Creatinine, Ser: 1.39 mg/dL — ABNORMAL HIGH (ref 0.76–1.27)
Globulin, Total: 2.7 g/dL (ref 1.5–4.5)
Glucose: 108 mg/dL — ABNORMAL HIGH (ref 70–99)
Potassium: 4.2 mmol/L (ref 3.5–5.2)
Sodium: 139 mmol/L (ref 134–144)
Total Protein: 7.7 g/dL (ref 6.0–8.5)
eGFR: 56 mL/min/{1.73_m2} — ABNORMAL LOW (ref >59)

## 2021-02-17 LAB — RPR+HIV+GC+CT PANEL
Chlamydia trachomatis, NAA: NEGATIVE
HIV Screen 4th Generation wRfx: NONREACTIVE
Neisseria Gonorrhoeae by PCR: NEGATIVE
RPR Ser Ql: NONREACTIVE

## 2021-02-17 LAB — LIPID PANEL
Chol/HDL Ratio: 7.7 ratio — ABNORMAL HIGH (ref 0.0–5.0)
Cholesterol, Total: 255 mg/dL — ABNORMAL HIGH (ref 100–199)
HDL: 33 mg/dL — ABNORMAL LOW (ref >39)
LDL Chol Calc (NIH): 171 mg/dL — ABNORMAL HIGH (ref 0–99)
Triglycerides: 270 mg/dL — ABNORMAL HIGH (ref 0–149)
VLDL Cholesterol Cal: 51 mg/dL — ABNORMAL HIGH (ref 5–40)

## 2021-02-19 ENCOUNTER — Encounter: Payer: Self-pay | Admitting: Family Medicine

## 2021-02-21 MED ORDER — ROSUVASTATIN CALCIUM 20 MG PO TABS: 20.0000 mg | ORAL_TABLET | Freq: Every day | ORAL | 3 refills | Status: DC

## 2021-02-27 ENCOUNTER — Telehealth: Payer: Self-pay

## 2021-02-27 DIAGNOSIS — E785 Hyperlipidemia, unspecified: Secondary | ICD-10-CM

## 2021-02-27 NOTE — Telephone Encounter (Signed)
Pt. Called stating that he needs to schedule f/u fatsing labs after start crestor about 1 week ago. If you could put the orders in for that.

## 2021-03-05 ENCOUNTER — Encounter: Payer: Self-pay | Admitting: Family Medicine

## 2021-03-07 ENCOUNTER — Other Ambulatory Visit: Payer: Self-pay

## 2021-03-07 ENCOUNTER — Telehealth: Payer: Self-pay

## 2021-03-07 DIAGNOSIS — Z1211 Encounter for screening for malignant neoplasm of colon: Secondary | ICD-10-CM

## 2021-03-07 NOTE — Telephone Encounter (Signed)
CALLED PATIENT NO ANSWER LEFT VOICEMAIL FOR A CALL BACK ? ?

## 2021-03-08 ENCOUNTER — Telehealth: Payer: Self-pay

## 2021-03-08 ENCOUNTER — Other Ambulatory Visit: Payer: Self-pay

## 2021-03-08 MED ORDER — NA SULFATE-K SULFATE-MG SULF 17.5-3.13-1.6 GM/177ML PO SOLN: 1.0000 | Freq: Once | ORAL | 0 refills | Status: DC

## 2021-03-08 NOTE — Telephone Encounter (Signed)
Patient stated he will call us

## 2021-03-08 NOTE — Telephone Encounter (Signed)
Patient called and left a voicemail and states he is ready to schedule his colonoscopy

## 2021-03-08 NOTE — Telephone Encounter (Signed)
Patient stated he needs an appointment for hemmoroids not colonoscopy got him scheduled with wohl on 04/16/2021

## 2021-04-03 DIAGNOSIS — L821 Other seborrheic keratosis: Secondary | ICD-10-CM | POA: Diagnosis not present

## 2021-04-03 DIAGNOSIS — D2262 Melanocytic nevi of left upper limb, including shoulder: Secondary | ICD-10-CM | POA: Diagnosis not present

## 2021-04-03 DIAGNOSIS — L570 Actinic keratosis: Secondary | ICD-10-CM | POA: Diagnosis not present

## 2021-04-03 DIAGNOSIS — D225 Melanocytic nevi of trunk: Secondary | ICD-10-CM | POA: Diagnosis not present

## 2021-04-03 DIAGNOSIS — D2261 Melanocytic nevi of right upper limb, including shoulder: Secondary | ICD-10-CM | POA: Diagnosis not present

## 2021-04-15 NOTE — Progress Notes (Signed)
? ? ?  Primary Care Physician: Denita Lung, MD ? ?Primary Gastroenterologist:  Dr. Lucilla Lame ? ?Chief Complaint  ?Patient presents with  ? Hemorrhoids  ? ? ?HPI: Curtis Walker is a 66 y.o. male here with a history of having colon polyps and is due for repeat colonoscopy in 2025.  The patient had a refer patient for a repeat colonoscopy but when called stated that he needed to be seen for his hemorrhoids. The patient's most recent lab work showed hyperlipidemia with normal LFTs. ?The patient reports that he very rarely has blood but feels a sensation like there is wetness from the hemorrhoids.  He states that the hemorrhoids will protrude out but only stay off for couple of hours then go back and by themselves. ? ?Past Medical History:  ?Diagnosis Date  ? Allergy   ? RHINITIS  ? Dyslipidemia   ? History of colonic polyps   ? ? ?Current Outpatient Medications  ?Medication Sig Dispense Refill  ? losartan (COZAAR) 50 MG tablet Take 1 tablet (50 mg total) by mouth daily. 90 tablet 3  ? naproxen sodium (ALEVE) 220 MG tablet Take 220 mg by mouth.    ? OMEPRAZOLE PO Take by mouth.    ? rosuvastatin (CRESTOR) 20 MG tablet Take 1 tablet (20 mg total) by mouth daily. 90 tablet 3  ? sildenafil (REVATIO) 20 MG tablet Take 1 to 5 holes daily as needed for erectile dysfunction 90 tablet 5  ? ?No current facility-administered medications for this visit.  ? ? ?Allergies as of 04/16/2021 - Review Complete 04/16/2021  ?Allergen Reaction Noted  ? Codeine Itching 01/07/2011  ? ? ?ROS: ? ?General: Negative for anorexia, weight loss, fever, chills, fatigue, weakness. ?ENT: Negative for hoarseness, difficulty swallowing , nasal congestion. ?CV: Negative for chest pain, angina, palpitations, dyspnea on exertion, peripheral edema.  ?Respiratory: Negative for dyspnea at rest, dyspnea on exertion, cough, sputum, wheezing.  ?GI: See history of present illness. ?GU:  Negative for dysuria, hematuria, urinary incontinence, urinary  frequency, nocturnal urination.  ?Endo: Negative for unusual weight change.  ?  ?Physical Examination: ? ? There were no vitals taken for this visit. ? ?General: Well-nourished, well-developed in no acute distress.  ?Eyes: No icterus. Conjunctivae pink. ?Neuro: Alert and oriented x 3.  Grossly intact. ?Skin: Warm and dry, no jaundice.   ?Psych: Alert and cooperative, normal mood and affect. ? ?Labs:  ?  ?Imaging Studies: ?No results found. ? ?Assessment and Plan:  ? ?Curtis Walker is a 66 y.o. y/o male who comes in with a report of trouble with his hemorrhoids.  The patient has intermittent bleeding and had a colonoscopy with hemorrhoids seen at that time.  The patient does have intermittent prolapsing of the hemorrhoids.  The patient will be set up with one of my partners for hemorrhoidal banding which she has been explained can happen in the office.  The patient has been explained the plan and agrees with it. ? ? ? ? ?Lucilla Lame, MD. Marval Regal ? ? ? Note: This dictation was prepared with Dragon dictation along with smaller phrase technology. Any transcriptional errors that result from this process are unintentional.  ?

## 2021-04-16 ENCOUNTER — Ambulatory Visit (INDEPENDENT_AMBULATORY_CARE_PROVIDER_SITE_OTHER): Payer: PPO | Admitting: Gastroenterology

## 2021-04-16 ENCOUNTER — Other Ambulatory Visit: Payer: Self-pay

## 2021-04-16 ENCOUNTER — Encounter: Payer: Self-pay | Admitting: Gastroenterology

## 2021-04-16 VITALS — BP 137/84 | HR 87 | Temp 98.4°F | Wt 220.0 lb

## 2021-04-16 DIAGNOSIS — K642 Third degree hemorrhoids: Secondary | ICD-10-CM | POA: Diagnosis not present

## 2021-04-23 ENCOUNTER — Telehealth: Payer: Self-pay

## 2021-04-23 ENCOUNTER — Other Ambulatory Visit: Payer: Self-pay

## 2021-04-23 ENCOUNTER — Other Ambulatory Visit: Payer: PPO

## 2021-04-23 DIAGNOSIS — E785 Hyperlipidemia, unspecified: Secondary | ICD-10-CM | POA: Diagnosis not present

## 2021-04-23 NOTE — Telephone Encounter (Signed)
Pt just started crestor and is having mucle cramp and shoulder tightness. Please advise [place] ?

## 2021-04-24 LAB — LIPID PANEL
Chol/HDL Ratio: 3.8 ratio (ref 0.0–5.0)
Cholesterol, Total: 157 mg/dL (ref 100–199)
HDL: 41 mg/dL (ref >39)
LDL Chol Calc (NIH): 88 mg/dL (ref 0–99)
Triglycerides: 160 mg/dL — ABNORMAL HIGH (ref 0–149)
VLDL Cholesterol Cal: 28 mg/dL (ref 5–40)

## 2021-04-24 NOTE — Telephone Encounter (Signed)
Pt was advised [facility] 

## 2021-05-07 ENCOUNTER — Other Ambulatory Visit: Payer: Self-pay

## 2021-05-08 ENCOUNTER — Encounter: Payer: Self-pay | Admitting: Gastroenterology

## 2021-05-08 ENCOUNTER — Ambulatory Visit (INDEPENDENT_AMBULATORY_CARE_PROVIDER_SITE_OTHER): Payer: PPO | Admitting: Gastroenterology

## 2021-05-08 ENCOUNTER — Other Ambulatory Visit: Payer: Self-pay

## 2021-05-08 VITALS — BP 131/83 | HR 87 | Temp 98.7°F | Wt 218.0 lb

## 2021-05-08 DIAGNOSIS — K642 Third degree hemorrhoids: Secondary | ICD-10-CM | POA: Diagnosis not present

## 2021-05-08 NOTE — Progress Notes (Signed)
Patient follow-ups today for banding of hemorrhoids ? ? ? ?Summary of history : ? ?He is a patient who sees Dr. Verl Blalock.  Has had discomfort from his hemorrhoids with feeling of seepage prolapsing.  He has had a colonoscopy in 2020 that showed grade 2 hemorrhoids.  And he has been referred for banding. ? ? ?Digital rectal exam performed in the presence of a chaperone. ?External anal findings: None ?Internal findings: , No masses, no blood on glove noticed. ? ? ? ?PROCEDURE NOTE: ?The patient presents with symptomatic grade 2 hemorrhoids, unresponsive to maximal medical therapy, requesting rubber band ligation of his/her hemorrhoidal disease.  All risks, benefits and alternative forms of therapy were described and informed consent was obtained. ? ?In the Left Lateral Decubitus position (if anoscopy is performed) anoscopic examination revealed grade 1 hemorrhoids in the all position(s).  ? ?The decision was made to band the RA internal hemorrhoid, and the [place] O?Regan System was used to perform band ligation without complication.  Digital anorectal examination was then performed to assure proper positioning of the band, and to adjust the banded tissue as required.  The patient was discharged home without pain or other issues.  Dietary and behavioral recommendations were given and (if necessary - prescriptions were given), along with follow-up instructions.  The patient will return 4 weeks for follow-up and possible additional banding as required. ? ?No complications were encountered and the patient tolerated the procedure well. ? ? ?Plan: ? ?Avoid constipation.  Commence on stool softeners if not already on ? ?Follow-up: 4 weeks ? ?Dr Jonathon Bellows MD,MRCP [facility]) ?Gastroenterology/Hepatology ?Pager: [phone] ?  ?

## 2021-06-11 ENCOUNTER — Ambulatory Visit (INDEPENDENT_AMBULATORY_CARE_PROVIDER_SITE_OTHER): Payer: PPO | Admitting: Gastroenterology

## 2021-06-11 ENCOUNTER — Encounter: Payer: Self-pay | Admitting: Gastroenterology

## 2021-06-11 VITALS — BP 134/81 | HR 83 | Temp 98.4°F | Ht 75.0 in | Wt 221.0 lb

## 2021-06-11 DIAGNOSIS — K642 Third degree hemorrhoids: Secondary | ICD-10-CM

## 2021-06-11 NOTE — Progress Notes (Signed)
Patient follow-ups today for banding of hemorrhoids ? ? ? ?Summary of history : ? ?He is a patient who sees Dr. Allen Norris  Has had discomfort from his hemorrhoids with feeling of seepage prolapsing.  He has had a colonoscopy in 2020 that showed grade 2 hemorrhoids.  And he has been referred for banding. ? ?First round:05/08/2021: RA column banded ? ?Interval history  05/08/2021-06/11/2021 ? ?Significant improvement after the last round of banding.  Significant improvement in reduce seepage and reduced prolapse of the hemorrhoids. ? ?Digital rectal exam performed in the presence of a chaperone. ?External anal findings: None ?Internal findings: None, No masses, no blood on glove noticed. ? ? ? ?PROCEDURE NOTE: ?The patient presents with symptomatic grade 2 hemorrhoids, unresponsive to maximal medical therapy, requesting rubber band ligation of his/her hemorrhoidal disease.  All risks, benefits and alternative forms of therapy were described and informed consent was obtained. ? ?In the Left Lateral Decubitus position (if anoscopy is performed) anoscopic examination revealed grade 1 hemorrhoids in the RP and LL position(s).  ? ?The decision was made to band the LL internal hemorrhoid, and the [place] O?Regan System was used to perform band ligation without complication.  Digital anorectal examination was then performed to assure proper positioning of the band, and to adjust the banded tissue as required.  The patient was discharged home without pain or other issues.  Dietary and behavioral recommendations were given and (if necessary - prescriptions were given), along with follow-up instructions.  The patient will return 4 weeks for follow-up and possible additional banding as required. ? ?No complications were encountered and the patient tolerated the procedure well. ? ? ?Plan: ? ?Avoid constipation.  Commence on stool softeners if not already on ? ?Follow-up:4 weeks ? ?Dr Jonathon Bellows MD,MRCP  [facility]) ?Gastroenterology/Hepatology ?Pager: [phone] ?  ?

## 2021-06-28 DIAGNOSIS — H1013 Acute atopic conjunctivitis, bilateral: Secondary | ICD-10-CM | POA: Diagnosis not present

## 2021-06-28 DIAGNOSIS — H2513 Age-related nuclear cataract, bilateral: Secondary | ICD-10-CM | POA: Diagnosis not present

## 2021-06-28 DIAGNOSIS — G514 Facial myokymia: Secondary | ICD-10-CM | POA: Diagnosis not present

## 2021-07-12 ENCOUNTER — Encounter: Payer: Self-pay | Admitting: Gastroenterology

## 2021-07-12 ENCOUNTER — Ambulatory Visit (INDEPENDENT_AMBULATORY_CARE_PROVIDER_SITE_OTHER): Payer: PPO | Admitting: Gastroenterology

## 2021-07-12 VITALS — BP 138/72 | HR 86 | Temp 98.2°F | Wt 220.0 lb

## 2021-07-12 DIAGNOSIS — K642 Third degree hemorrhoids: Secondary | ICD-10-CM | POA: Diagnosis not present

## 2021-07-12 NOTE — Patient Instructions (Signed)
Please give [place] a call if you have any issues.

## 2021-07-12 NOTE — Progress Notes (Signed)
Patient follow-ups today for banding of hemorrhoids    Summary of history :  He is a patient who sees Dr. Allen Norris  Has had discomfort from his hemorrhoids with feeling of seepage prolapsing.  He has had a colonoscopy in 2020 that showed grade 2 hemorrhoids.  And he has been referred for banding.   First round:05/08/2021: RA column banded Second round:06/11/2021 : LL column baded   Interval history   06/11/2021-07/12/2021 Doing well no complaints   Digital rectal exam performed in the presence of a chaperone. External anal findings: None Internal findings: , No masses, no blood on glove noticed.    PROCEDURE NOTE: The patient presents with symptomatic grade 1 hemorrhoids, unresponsive to maximal medical therapy, requesting rubber band ligation of his/her hemorrhoidal disease.  All risks, benefits and alternative forms of therapy were described and informed consent was obtained.  In the Left Lateral Decubitus position (if anoscopy is performed) anoscopic examination revealed grade 1 hemorrhoids in the RP position(s).   The decision was made to band the RP internal hemorrhoid, and the [place] was used to perform band ligation without complication.  Digital anorectal examination was then performed to assure proper positioning of the band, and to adjust the banded tissue as required.  The patient was discharged home without pain or other issues.  Dietary and behavioral recommendations were given and (if necessary - prescriptions were given), along with follow-up instructions.  The patient will retur  for follow-up and possible additional banding as required.  No complications were encountered and the patient tolerated the procedure well.   Plan:  Avoid constipation.  Commence on stool softeners if not already on  Follow-up: As needed  Dr Jonathon Bellows MD,MRCP [facility]) Gastroenterology/Hepatology Pager: [phone]

## 2021-08-07 ENCOUNTER — Encounter: Payer: Self-pay | Admitting: Family Medicine

## 2021-08-07 ENCOUNTER — Ambulatory Visit (INDEPENDENT_AMBULATORY_CARE_PROVIDER_SITE_OTHER): Payer: PPO | Admitting: Family Medicine

## 2021-08-07 VITALS — BP 130/74 | HR 86 | Temp 98.1°F | Wt 219.8 lb

## 2021-08-07 DIAGNOSIS — J0100 Acute maxillary sinusitis, unspecified: Secondary | ICD-10-CM

## 2021-08-07 MED ORDER — AMOXICILLIN-POT CLAVULANATE 875-125 MG PO TABS: 1.0000 | ORAL_TABLET | Freq: Two times a day (BID) | ORAL | 0 refills | Status: DC

## 2021-10-17 ENCOUNTER — Encounter: Payer: Self-pay | Admitting: Internal Medicine

## 2021-11-20 ENCOUNTER — Encounter: Payer: Self-pay | Admitting: Internal Medicine

## 2021-12-03 ENCOUNTER — Encounter: Payer: Self-pay | Admitting: Internal Medicine

## 2022-01-24 ENCOUNTER — Telehealth: Payer: Self-pay | Admitting: Family Medicine

## 2022-01-24 NOTE — Telephone Encounter (Signed)
Left message for patient to call back and schedule Medicare Annual Wellness Visit (AWV) either virtually or in office. Left  my [name] number [phone]   Last AWV 02/15/21 please schedule with Nurse Health Adviser   45 min for awv-i and in office appointments 30 min for awv-s  phone/virtual appointments

## 2022-02-13 ENCOUNTER — Other Ambulatory Visit: Payer: Self-pay

## 2022-02-13 ENCOUNTER — Encounter: Payer: Self-pay | Admitting: *Deleted

## 2022-02-13 ENCOUNTER — Emergency Department
Admission: EM | Admit: 2022-02-13 | Discharge: 2022-02-13 | Disposition: A | Discharge status: Home / Self Care | Payer: PPO | Attending: Emergency Medicine | Admitting: Emergency Medicine

## 2022-02-13 DIAGNOSIS — Z79899 Other long term (current) drug therapy: Secondary | ICD-10-CM | POA: Diagnosis not present

## 2022-02-13 DIAGNOSIS — I10 Essential (primary) hypertension: Secondary | ICD-10-CM | POA: Diagnosis not present

## 2022-02-13 LAB — BASIC METABOLIC PANEL
Anion gap: 10 (ref 5–15)
BUN: 28 mg/dL — ABNORMAL HIGH (ref 8–23)
CO2: 27 mmol/L (ref 22–32)
Calcium: 9.6 mg/dL (ref 8.9–10.3)
Chloride: 102 mmol/L (ref 98–111)
Creatinine, Ser: 1.42 mg/dL — ABNORMAL HIGH (ref 0.61–1.24)
GFR, Estimated: 54 mL/min — ABNORMAL LOW (ref >60)
Glucose, Bld: 123 mg/dL — ABNORMAL HIGH (ref 70–99)
Potassium: 3.7 mmol/L (ref 3.5–5.1)
Sodium: 139 mmol/L (ref 135–145)

## 2022-02-13 LAB — CBC
HCT: 43.2 % (ref 39.0–52.0)
Hemoglobin: 14.6 g/dL (ref 13.0–17.0)
MCH: 29.9 pg (ref 26.0–34.0)
MCHC: 33.8 g/dL (ref 30.0–36.0)
MCV: 88.3 fL (ref 80.0–100.0)
Platelets: 321 10*3/uL (ref 150–400)
RBC: 4.89 MIL/uL (ref 4.22–5.81)
RDW: 12.5 % (ref 11.5–15.5)
WBC: 11.8 10*3/uL — ABNORMAL HIGH (ref 4.0–10.5)
nRBC: 0 % (ref 0.0–0.2)

## 2022-02-13 LAB — TROPONIN I (HIGH SENSITIVITY): Troponin I (High Sensitivity): 10 ng/L (ref <18)

## 2022-02-13 NOTE — ED Provider Notes (Signed)
[facility] Provider Note    Event Date/Time   First MD Initiated Contact with Patient 02/13/22 2154     (approximate)   History   Hypertension   HPI  Curtis Walker is a 67 y.o. male with a history of high blood pressure who takes losartan 50 mg who reports over the last week he has been checking his blood pressure and noticed it has been higher than typical.  He has been checking his blood pressure because he has a physical coming up with his doctor next week.  Reports compliance with his blood pressure medications.  No chest pain, no nausea vomiting no shortness of breath.  Feels well.  Today checked his blood pressure and it was 409 systolic and he became concerned and presented to the emergency department.  He feels well     Physical Exam   Triage Vital Signs: ED Triage Vitals  Enc Vitals Group     BP 02/13/22 1919 (!) 163/89     Pulse Rate 02/13/22 1919 86     Resp 02/13/22 1919 18     Temp 02/13/22 1919 98.1 F (36.7 C)     Temp Source 02/13/22 1919 Oral     SpO2 02/13/22 1919 97 %     Weight 02/13/22 1920 99.8 kg (220 lb)     Height 02/13/22 1920 1.905 m ('6\' 3"'$ )     Head Circumference --      Peak Flow --      Pain Score 02/13/22 1923 0     Pain Loc --      Pain Edu? --      Excl. in [place]? --     Most recent vital signs: Vitals:   02/13/22 1919  BP: (!) 163/89  Pulse: 86  Resp: 18  Temp: 98.1 F (36.7 C)  SpO2: 97%     General: Awake, no distress.  CV:  Good peripheral perfusion.  Regular rate and rhythm Resp:  Normal effort.  CTA bilaterally Abd:  No distention.  Other:  No calf pain or swelling   ED Results / Procedures / Treatments   Labs (all labs ordered are listed, but only abnormal results are displayed) Labs Reviewed  BASIC METABOLIC PANEL - Abnormal; Notable for the following components:      Result Value   Glucose, Bld 123 (*)    BUN 28 (*)    Creatinine, Ser 1.42 (*)    GFR, Estimated 54 (*)    All  other components within normal limits  CBC - Abnormal; Notable for the following components:   WBC 11.8 (*)    All other components within normal limits  TROPONIN I (HIGH SENSITIVITY)     EKG  ED ECG REPORT I, Lavonia Drafts, the attending physician, personally viewed and interpreted this ECG.  Date: 02/13/2022  Rhythm: normal sinus rhythm QRS Axis: normal Intervals: normal ST/T Wave abnormalities: normal Narrative Interpretation: no evidence of acute ischemia    RADIOLOGY     PROCEDURES:  Critical Care performed:   Procedures   MEDICATIONS ORDERED IN ED: Medications - No data to display   IMPRESSION / MDM / [place] / ED COURSE  I reviewed the triage vital signs and the nursing notes. Patient's presentation is most consistent with exacerbation of chronic illness.   Patient presents with elevated blood pressure as detailed above.  Does have a history of high blood pressure.  He takes losartan 50 mg.  He is  asymptomatic.  Lab work reviewed and is overall reassuring, recommended increased p.o. hydration.  EKG high since 50 troponin are unremarkable.  Given that he has follow-up with his PCP within 1 week, recommend continue losartan 50, start blood pressure log, no indication for admission or other workup at this time       FINAL CLINICAL IMPRESSION(S) / ED DIAGNOSES   Final diagnoses:  Primary hypertension     Rx / DC Orders   ED Discharge Orders     None        Note:  This document was prepared using Dragon voice recognition software and may include unintentional dictation errors.   Lavonia Drafts, MD 02/13/22 [phone]

## 2022-02-13 NOTE — ED Provider Triage Note (Signed)
Emergency Medicine Provider Triage Evaluation Note  Curtis Walker, a 67 y.o. male  was evaluated in triage.  Pt complains of evaded blood pressure readings.  With history of hypertension, takes losartan daily as directed.  He took a 4 mile walk today, excited to evaluate his blood pressure ahead of his wellness check next week.  He noted elevated blood pressures but denies any associated headache, dizziness, vision change, chest pain, or shortness of breath.  Review of Systems  Positive: Elevated BP readings Negative: CP, SOB  Physical Exam  BP (!) 163/89 (BP Location: Left Arm)   Pulse 86   Temp 98.1 F (36.7 C) (Oral)   Resp 18   Ht '6\' 3"'$  (1.905 m)   Wt 99 kg   SpO2 97%   BMI 27.28 kg/m  Gen:   Awake, no distress  NAD Resp:  Normal effort  MSK:   Moves extremities without difficulty  Other:    Medical Decision Making  Medically screening exam initiated at 7:33 PM.  Appropriate orders placed.  Curtis Walker was informed that the remainder of the evaluation will be completed by another provider, this initial triage assessment does not replace that evaluation, and the importance of remaining in the ED until their evaluation is complete.  Patient to the ED for evaluation of elevated blood pressure readings.  Patient with a history of hypertension, takes losartan as directed.   Curtis Walker, [place] 02/13/22 1934

## 2022-02-13 NOTE — ED Triage Notes (Signed)
Pt reports high blood pressure for past 2 weeks.  No chest pain or sob.  No headache.  No n/v/d.  Pt reports taking bp meds .  Pt alert, speech clear.

## 2022-02-14 ENCOUNTER — Telehealth: Payer: Self-pay | Admitting: Family Medicine

## 2022-02-14 NOTE — Telephone Encounter (Signed)
Advised pt of same. 

## 2022-02-14 NOTE — Telephone Encounter (Signed)
Pt went to er the other day as his BP was going up.  I explained he should always call us first and we can help. He said ER told him to keep up with the numbers until his upcoming wellness visit with [place].  Today his bp is 178/99 Bp has been high for about 1 1/2 weeks.  Do you want to see him before his wellness visit?

## 2022-02-15 ENCOUNTER — Telehealth: Payer: Self-pay

## 2022-02-15 NOTE — Telephone Encounter (Signed)
        Patient  visited [place] on 1/3     Telephone encounter attempt :    A HIPAA compliant voice message was left requesting a return call.  Instructed patient to call back     [place], [facility]  [phone] [address], [place], [place] [address] Phone: [phone] Email: [email or web address]

## 2022-02-18 ENCOUNTER — Telehealth: Payer: Self-pay

## 2022-02-18 NOTE — Telephone Encounter (Signed)
     Patient  visit on 1/3  at [facility]   Have you been able to follow up with your primary care physician?yes  The patient was or was not able to obtain any needed medicine or equipment. NA  Are there diet recommendations that you are having difficulty following?NA  Patient expresses understanding of discharge instructions and education provided has no other needs at this time.  Yes     [place], [facility], Care Management  [phone] [address], [place], [place] [address] Phone: [phone] Email: [email or web address]

## 2022-02-19 ENCOUNTER — Ambulatory Visit (INDEPENDENT_AMBULATORY_CARE_PROVIDER_SITE_OTHER): Payer: PPO

## 2022-02-19 VITALS — Ht 75.0 in | Wt 215.0 lb

## 2022-02-19 DIAGNOSIS — Z0000 Encounter for general adult medical examination without abnormal findings: Secondary | ICD-10-CM

## 2022-02-19 NOTE — Progress Notes (Signed)
I connected with Curtis Walker today by telephone and verified that I am speaking with the correct person using two identifiers. Location patient: home Location provider: work Persons participating in the virtual visit: Curtis Walker, Curtis Durand LPN.   I discussed the limitations, risks, security and privacy concerns of performing an evaluation and management service by telephone and the availability of in person appointments. I also discussed with the patient that there may be a patient responsible charge related to this service. The patient expressed understanding and verbally consented to this telephonic visit.    Interactive audio and video telecommunications were attempted between this provider and patient, however failed, due to patient having technical difficulties OR patient did not have access to video capability.  We continued and completed visit with audio only.     Vital signs may be patient reported or missing.  Subjective:   Curtis Walker is a 67 y.o. male who presents for Medicare Annual/Subsequent preventive examination.  Review of Systems     Cardiac Risk Factors include: advanced age (>80mn, >>42women);dyslipidemia;hypertension;male gender     Objective:    Today's Vitals   02/19/22 1556  Weight: 215 lb (97.5 kg)  Height: '6\' 3"'$  (1.905 m)   Body mass index is 26.87 kg/m.     02/19/2022    4:03 PM 02/13/2022    7:25 PM 02/15/2021   10:40 AM 12/15/2019    1:55 PM 12/14/2018    2:35 PM 11/24/2018    7:11 AM  Advanced Directives  Does Patient Have a Medical Advance Directive? No No No No No No  Would patient like information on creating a medical advance directive?   Yes (ED - Information included in AVS) Yes (ED - Information included in AVS) Yes (MAU/Ambulatory/Procedural Areas - Information given) No - Patient declined    Current Medications (verified) Outpatient Encounter Medications as of 02/19/2022  Medication Sig   acetaminophen (TYLENOL) 325 MG  tablet Take 650 mg by mouth every 6 (six) hours as needed.   losartan (COZAAR) 50 MG tablet Take 1 tablet (50 mg total) by mouth daily.   rosuvastatin (CRESTOR) 20 MG tablet Take 1 tablet (20 mg total) by mouth daily.   amoxicillin-clavulanate (AUGMENTIN) 875-125 MG tablet Take 1 tablet by mouth 2 (two) times daily. (Patient not taking: Reported on 02/19/2022)   BCurry5-2.5-18.5 LF-MCG/0.5 injection  (Patient not taking: Reported on 08/07/2021)   naproxen sodium (ALEVE) 220 MG tablet Take 220 mg by mouth. (Patient not taking: Reported on 08/07/2021)   OMEPRAZOLE PO Take by mouth. (Patient not taking: Reported on 08/07/2021)   sildenafil (REVATIO) 20 MG tablet Take 1 to 5 holes daily as needed for erectile dysfunction (Patient not taking: Reported on 02/19/2022)   No facility-administered encounter medications on file as of 02/19/2022.    Allergies (verified) Codeine   History: Past Medical History:  Diagnosis Date   Allergy    RHINITIS   Dyslipidemia    History of colonic polyps    Past Surgical History:  Procedure Laterality Date   COLONOSCOPY  2006   Dr. HBenson [place]  COLONOSCOPY WITH PROPOFOL N/A 11/24/2018   Procedure: COLONOSCOPY WITH PROPOFOL;  Surgeon: WLucilla Lame MD;  [facility]  Service: Endoscopy;  Laterality: N/A;   fracture repair right humerus     Family History  Problem Relation Age of Onset   Asthma Mother    Mental illness Mother    Arthritis Father    Hypertension Father    Hypertension  Paternal Grandfather    Heart disease Paternal Grandfather    Social History   Socioeconomic History   Marital status: Curtis Walker    Spouse name: Not on file   Number of children: Not on file   Years of education: Not on file   Highest education level: Not on file  Occupational History   Not on file  Tobacco Use   Smoking status: Former    Types: Cigarettes    Quit date: 01/17/1991    Years since quitting: 31.1   Smokeless tobacco: Never  Vaping Use    Vaping Use: Never used  Substance and Sexual Activity   Alcohol use: Yes    Comment: rare   Drug use: No   Sexual activity: Yes  Other Topics Concern   Not on file  Social History Narrative   Not on file   Social Determinants of Health   Financial Resource Strain: Low Risk  (02/19/2022)   Overall Financial Resource Strain (CARDIA)    Difficulty of Paying Living Expenses: Not hard at all  Food Insecurity: No Food Insecurity (02/19/2022)   Hunger Vital Sign    Worried About Running Out of Food in the Last Year: Never true    [place] in the Last Year: Never true  Transportation Needs: No Transportation Needs (02/19/2022)   PRAPARE - Hydrologist (Medical): No    Lack of Transportation (Non-Medical): No  Physical Activity: Sufficiently Active (02/19/2022)   Exercise Vital Sign    Days of Exercise per Week: 7 days    Minutes of Exercise per Session: 60 min  Stress: No Stress Concern Present (02/19/2022)   [place]    Feeling of Stress : Not at all  Social Connections: Not on file    Tobacco Counseling Counseling given: Not Answered   Clinical Intake:  Pre-visit preparation completed: Yes  Pain : No/denies pain     Nutritional Status: BMI 25 -29 Overweight Nutritional Risks: None Diabetes: No  How often do you need to have someone help you when you read instructions, pamphlets, or other written materials from your doctor or pharmacy?: 1 - Never  Diabetic? no  Interpreter Needed?: No  Information entered by :: NAllen LPN   Activities of Daily Living    02/19/2022    4:06 PM  In your present state of health, do you have any difficulty performing the following activities:  Hearing? 0  Vision? 0  Difficulty concentrating or making decisions? 0  Walking or climbing stairs? 0  Dressing or bathing? 0  Doing errands, shopping? 0  Preparing Food and eating ? N  Using the  Toilet? N  In the past six months, have you accidently leaked urine? N  Do you have problems with loss of bowel control? N  Managing your Medications? N  Managing your Finances? N  Housekeeping or managing your Housekeeping? N    Patient Care Team: Denita Lung, MD as PCP - General (Family Medicine)  Indicate any recent Medical Services you may have received from other than [facility] providers in the past year (date may be approximate).     Assessment:   This is a routine wellness examination for Jamaris.  Hearing/Vision screen Vision Screening - Comments:: No regular eye exams,  Dietary issues and exercise activities discussed: Current Exercise Habits: Home exercise routine, Type of exercise: walking, Time (Minutes): 60, Frequency (Times/Week): 7, Weekly Exercise (Minutes/Week): 420  Goals Addressed             This Visit's Progress    Patient Stated       02/19/2022, no goals       Depression Screen    02/19/2022    4:04 PM 02/15/2021   10:41 AM 12/15/2019    1:57 PM 12/14/2018    2:06 PM 01/19/2018    3:16 PM 01/16/2017    8:50 AM 01/16/2016    9:07 AM  PHQ 2/9 Scores  PHQ - 2 Score 0 0 0 0 0 0 0  PHQ- 9 Score 0          Fall Risk    02/19/2022    4:04 PM 02/15/2021   10:41 AM 12/15/2019    1:56 PM 09/07/2019    4:16 PM 12/14/2018    2:06 PM  [place] in the past year? 0 0 0 0 0  Comment    Emmi Telephone Survey: data to providers prior to load   Number falls in past yr: 0 0     Injury with Fall? 0 0     Risk for fall due to : Medication side effect No Fall Risks No Fall Risks    Follow up Falls prevention discussed;Education provided;Falls evaluation completed Falls evaluation completed       FALL RISK PREVENTION PERTAINING TO THE HOME:  Any stairs in or around the home? Yes  If so, are there any without handrails? No  Home free of loose throw rugs in walkways, pet beds, electrical cords, etc? Yes  Adequate lighting in your home to reduce risk of  falls? Yes   ASSISTIVE DEVICES UTILIZED TO PREVENT FALLS:  Life alert? No  Use of a cane, walker or w/c? No  Grab bars in the bathroom? No  Shower chair or bench in shower? No  Elevated toilet seat or a handicapped toilet? No   TIMED UP AND GO:  Was the test performed? No .      Cognitive Function:        02/19/2022    4:07 PM  6CIT Screen  What Year? 0 points  What month? 0 points  What time? 0 points  Count back from 20 0 points  Months in reverse 0 points  Repeat phrase 2 points  Total Score 2 points    Immunizations Immunization History  Administered Date(s) Administered   DTaP 07/28/2001   Fluad Quad(high Dose 65+) 12/14/2018, 12/15/2019, 11/09/2021   Influenza Split 01/08/2011   Influenza Whole 01/01/2010   Influenza, High Dose Seasonal PF 11/24/2020   Influenza, Seasonal, Injecte, Preservative Fre 01/17/2012   Influenza,inj,Quad PF,6+ Mos 01/18/2013, 12/28/2013, 01/03/2015, 01/16/2016, 01/16/2017, 01/19/2018   Moderna SARS-COV2 Booster Vaccination 05/19/2020   Moderna Sars-Covid-2 Vaccination 03/11/2019, 04/08/2019, 12/10/2019   Pfizer Covid-19 Vaccine Bivalent Booster 56yr & up 11/24/2020   Pneumococcal Conjugate-13 01/13/2020   Pneumococcal Polysaccharide-23 12/14/2018   RSV,unspecified 11/18/2021   Tdap 01/08/2011, 03/01/2021   Unspecified SARS-COV-2 Vaccination 11/09/2021   Zoster Recombinat (Shingrix) 01/16/2017, 03/20/2017   Zoster, Live 12/28/2013    TDAP status: Up to date  Flu Vaccine status: Up to date  Pneumococcal vaccine status: Up to date  Covid-19 vaccine status: Completed vaccines  Qualifies for Shingles Vaccine? Yes   Zostavax completed Yes   Shingrix Completed?: Yes  Screening Tests Health Maintenance  Topic Date Due   COVID-19 Vaccine (7 - 2023-24 season) 01/04/2022   Medicare Annual Wellness (AWV)  02/15/2022  COLONOSCOPY (Pts 45-82yr Insurance coverage will need to be confirmed)  11/24/2023   DTaP/Tdap/Td (4 - Td or  Tdap) 03/02/2031   Pneumonia Vaccine 69 Years old  Completed   INFLUENZA VACCINE  Completed   Hepatitis C Screening  Completed   Zoster Vaccines- Shingrix  Completed   HPV VACCINES  Aged Out    Health Maintenance  Health Maintenance Due  Topic Date Due   COVID-19 Vaccine (7 - 2023-24 season) 01/04/2022   Medicare Annual Wellness (AWV)  02/15/2022    Colorectal cancer screening: Type of screening: Colonoscopy. Completed 11/24/2018. Repeat every 5 years  Lung Cancer Screening: (Low Dose CT Chest recommended if Age 69-80years, 30 pack-year currently smoking OR have quit w/in 15years.) does not qualify.   Lung Cancer Screening Referral: no  Additional Screening:  Hepatitis C Screening: does qualify; Completed 01/03/2015  Vision Screening: Recommended annual ophthalmology exams for early detection of glaucoma and other disorders of the eye. Is the patient up to date with their annual eye exam?  No  Who is the provider or what is the name of the office in which the patient attends annual eye exams?  If pt is not established with a provider, would they like to be referred to a provider to establish care? No .   Dental Screening: Recommended annual dental exams for proper oral hygiene  Community Resource Referral / Chronic Care Management: CRR required this visit?  No   CCM required this visit?  No      Plan:     I have personally reviewed and noted the following in the patient's chart:   Medical and social history Use of alcohol, tobacco or illicit drugs  Current medications and supplements including opioid prescriptions. Patient is not currently taking opioid prescriptions. Functional ability and status Nutritional status Physical activity Advanced directives List of other physicians Hospitalizations, surgeries, and ER visits in previous 12 months Vitals Screenings to include cognitive, depression, and falls Referrals and appointments  In addition, I have  reviewed and discussed with patient certain preventive protocols, quality metrics, and best practice recommendations. A written personalized care plan for preventive services as well as general preventive health recommendations were provided to patient.     NKellie Simmering LPN   01/14/5572  Nurse Notes: none  Due to this being a virtual visit, the after visit summary with patients personalized plan was offered to patient via mail or my-chart.  Patient would like to access on my-chart

## 2022-02-19 NOTE — Patient Instructions (Signed)
Curtis Walker , Thank you for taking time to come for your Medicare Wellness Visit. I appreciate your ongoing commitment to your health goals. Please review the following plan we discussed and let me know if I can assist you in the future.   These are the goals we discussed:  Goals      Patient Stated     02/19/2022, no goals        This is a list of the screening recommended for you and due dates:  Health Maintenance  Topic Date Due   COVID-19 Vaccine (7 - 2023-24 season) 01/04/2022   Medicare Annual Wellness Visit  02/20/2023   Colon Cancer Screening  11/24/2023   DTaP/Tdap/Td vaccine (4 - Td or Tdap) 03/02/2031   Pneumonia Vaccine  Completed   Flu Shot  Completed   Hepatitis C Screening: USPSTF Recommendation to screen - Ages 18-69 yo.  Completed   Zoster (Shingles) Vaccine  Completed   HPV Vaccine  Aged Out    Advanced directives: Advance directive discussed with you today.   Conditions/risks identified: none  Next appointment: Follow up in one year for your annual wellness visit.   Preventive Care 69 Years and Older, Male  Preventive care refers to lifestyle choices and visits with your health care provider that can promote health and wellness. What does preventive care include? A yearly physical exam. This is also called an annual well check. Dental exams once or twice a year. Routine eye exams. Ask your health care provider how often you should have your eyes checked. Personal lifestyle choices, including: Daily care of your teeth and gums. Regular physical activity. Eating a healthy diet. Avoiding tobacco and drug use. Limiting alcohol use. Practicing safe sex. Taking low doses of aspirin every day. Taking vitamin and mineral supplements as recommended by your health care provider. What happens during an annual well check? The services and screenings done by your health care provider during your annual well check will depend on your age, overall health, lifestyle  risk factors, and family history of disease. Counseling  Your health care provider may ask you questions about your: Alcohol use. Tobacco use. Drug use. Emotional well-being. Home and relationship well-being. Sexual activity. Eating habits. History of falls. Memory and ability to understand (cognition). Work and work Statistician. Screening  You may have the following tests or measurements: Height, weight, and BMI. Blood pressure. Lipid and cholesterol levels. These may be checked every 5 years, or more frequently if you are over 48 years old. Skin check. Lung cancer screening. You may have this screening every year starting at age 69 if you have a 30-pack-year history of smoking and currently smoke or have quit within the past 15 years. Fecal occult blood test (FOBT) of the stool. You may have this test every year starting at age 69. Flexible sigmoidoscopy or colonoscopy. You may have a sigmoidoscopy every 5 years or a colonoscopy every 10 years starting at age 38. Prostate cancer screening. Recommendations will vary depending on your family history and other risks. Hepatitis C blood test. Hepatitis B blood test. Sexually transmitted disease (STD) testing. Diabetes screening. This is done by checking your blood sugar (glucose) after you have not eaten for a while (fasting). You may have this done every 1-3 years. Abdominal aortic aneurysm (AAA) screening. You may need this if you are a current or former smoker. Osteoporosis. You may be screened starting at age 69 if you are at high risk. Talk with your health care provider  about your test results, treatment options, and if necessary, the need for more tests. Vaccines  Your health care provider may recommend certain vaccines, such as: Influenza vaccine. This is recommended every year. Tetanus, diphtheria, and acellular pertussis (Tdap, Td) vaccine. You may need a Td booster every 10 years. Zoster vaccine. You may need this after age  69. Pneumococcal 13-valent conjugate (PCV13) vaccine. One dose is recommended after age 18. Pneumococcal polysaccharide (PPSV23) vaccine. One dose is recommended after age 22. Talk to your health care provider about which screenings and vaccines you need and how often you need them. This information is not intended to replace advice given to you by your health care provider. Make sure you discuss any questions you have with your health care provider. Document Released: 02/24/2015 Document Revised: 10/18/2015 Document Reviewed: 11/29/2014 Elsevier Interactive Patient Education  2017 [place] Prevention in the Home Falls can cause injuries. They can happen to people of all ages. There are many things you can do to make your home safe and to help prevent falls. What can I do on the outside of my home? Regularly fix the edges of walkways and driveways and fix any cracks. Remove anything that might make you trip as you walk through a door, such as a raised step or threshold. Trim any bushes or trees on the path to your home. Use bright outdoor lighting. Clear any walking paths of anything that might make someone trip, such as rocks or tools. Regularly check to see if handrails are loose or broken. Make sure that both sides of any steps have handrails. Any raised decks and porches should have guardrails on the edges. Have any leaves, snow, or ice cleared regularly. Use sand or salt on walking paths during winter. Clean up any spills in your garage right away. This includes oil or grease spills. What can I do in the bathroom? Use night lights. Install grab bars by the toilet and in the tub and shower. Do not use towel bars as grab bars. Use non-skid mats or decals in the tub or shower. If you need to sit down in the shower, use a plastic, non-slip stool. Keep the floor dry. Clean up any water that spills on the floor as soon as it happens. Remove soap buildup in the tub or shower  regularly. Attach bath mats securely with double-sided non-slip rug tape. Do not have throw rugs and other things on the floor that can make you trip. What can I do in the bedroom? Use night lights. Make sure that you have a light by your bed that is easy to reach. Do not use any sheets or blankets that are too big for your bed. They should not hang down onto the floor. Have a firm chair that has side arms. You can use this for support while you get dressed. Do not have throw rugs and other things on the floor that can make you trip. What can I do in the kitchen? Clean up any spills right away. Avoid walking on wet floors. Keep items that you use a lot in easy-to-reach places. If you need to reach something above you, use a strong step stool that has a grab bar. Keep electrical cords out of the way. Do not use floor polish or wax that makes floors slippery. If you must use wax, use non-skid floor wax. Do not have throw rugs and other things on the floor that can make you trip. What can I do  with my stairs? Do not leave any items on the stairs. Make sure that there are handrails on both sides of the stairs and use them. Fix handrails that are broken or loose. Make sure that handrails are as long as the stairways. Check any carpeting to make sure that it is firmly attached to the stairs. Fix any carpet that is loose or worn. Avoid having throw rugs at the top or bottom of the stairs. If you do have throw rugs, attach them to the floor with carpet tape. Make sure that you have a light switch at the top of the stairs and the bottom of the stairs. If you do not have them, ask someone to add them for you. What else can I do to help prevent falls? Wear shoes that: Do not have high heels. Have rubber bottoms. Are comfortable and fit you well. Are closed at the toe. Do not wear sandals. If you use a stepladder: Make sure that it is fully opened. Do not climb a closed stepladder. Make sure that  both sides of the stepladder are locked into place. Ask someone to hold it for you, if possible. Clearly mark and make sure that you can see: Any grab bars or handrails. First and last steps. Where the edge of each step is. Use tools that help you move around (mobility aids) if they are needed. These include: Canes. Walkers. Scooters. Crutches. Turn on the lights when you go into a dark area. Replace any light bulbs as soon as they burn out. Set up your furniture so you have a clear path. Avoid moving your furniture around. If any of your floors are uneven, fix them. If there are any pets around you, be aware of where they are. Review your medicines with your doctor. Some medicines can make you feel dizzy. This can increase your chance of falling. Ask your doctor what other things that you can do to help prevent falls. This information is not intended to replace advice given to you by your health care provider. Make sure you discuss any questions you have with your health care provider. Document Released: 11/24/2008 Document Revised: 07/06/2015 Document Reviewed: 03/04/2014 Elsevier Interactive Patient Education  2017 [employer].

## 2022-02-20 ENCOUNTER — Encounter: Payer: Self-pay | Admitting: Family Medicine

## 2022-02-20 ENCOUNTER — Ambulatory Visit (INDEPENDENT_AMBULATORY_CARE_PROVIDER_SITE_OTHER): Payer: PPO | Admitting: Family Medicine

## 2022-02-20 VITALS — BP 138/84 | HR 75 | Resp 16 | Ht 74.5 in | Wt 215.0 lb

## 2022-02-20 DIAGNOSIS — N1831 Chronic kidney disease, stage 3a: Secondary | ICD-10-CM | POA: Diagnosis not present

## 2022-02-20 DIAGNOSIS — E785 Hyperlipidemia, unspecified: Secondary | ICD-10-CM | POA: Diagnosis not present

## 2022-02-20 DIAGNOSIS — Z131 Encounter for screening for diabetes mellitus: Secondary | ICD-10-CM

## 2022-02-20 DIAGNOSIS — Z860100 Personal history of colon polyps, unspecified: Secondary | ICD-10-CM

## 2022-02-20 DIAGNOSIS — Z87891 Personal history of nicotine dependence: Secondary | ICD-10-CM

## 2022-02-20 DIAGNOSIS — I10 Essential (primary) hypertension: Secondary | ICD-10-CM

## 2022-02-20 DIAGNOSIS — J301 Allergic rhinitis due to pollen: Secondary | ICD-10-CM | POA: Diagnosis not present

## 2022-02-20 DIAGNOSIS — N529 Male erectile dysfunction, unspecified: Secondary | ICD-10-CM

## 2022-02-20 DIAGNOSIS — Z209 Contact with and (suspected) exposure to unspecified communicable disease: Secondary | ICD-10-CM | POA: Diagnosis not present

## 2022-02-20 LAB — POCT GLYCOSYLATED HEMOGLOBIN (HGB A1C): Hemoglobin A1C: 6.1 % — AB (ref 4.0–5.6)

## 2022-02-20 MED ORDER — LOSARTAN POTASSIUM 50 MG PO TABS: 50.0000 mg | ORAL_TABLET | Freq: Every day | ORAL | 3 refills | Status: DC

## 2022-02-20 MED ORDER — ROSUVASTATIN CALCIUM 20 MG PO TABS: 20.0000 mg | ORAL_TABLET | Freq: Every day | ORAL | 3 refills | Status: DC

## 2022-02-20 NOTE — Progress Notes (Signed)
Complete physical exam  Patient: Curtis Walker   DOB: 11-26-1955   67 y.o. Male  MRN: [id number]  Subjective:    Chief Complaint  Patient presents with   Annual Exam    Had AWV with [facility] yesterday. Fasting.    Curtis Walker is a 67 y.o. male who presents today for a complete physical exam. He reports consuming a general diet. Home exercise routine includes walking 2-4 miles daily. He generally feels well. He reports sleeping fairly well. He was recently seen in the emergency room for evaluation of an elevated blood pressure.  He does have a blood pressure at home.  He did not bring it with him.  The emergency room record also showed an elevated blood sugar and decreased kidney function.  He does have underlying allergies that he is taking care of with OTC medications.  He also has a history of colonic polyp review of the record indicates he has had multiple polyps with his last visit.  Including adenomatous as well as serrated polyps.  He continues on Crestor as well as losartan and is having no difficulty with that.  His sexual activity is been limited but he would still like to be tested to be on the safe side.  He has been in 30+ year relationship but they did not get married and he has not taken steps to take care of living will.  He is retired.  Otherwise his family and social history as well as health maintenance and immunizations was reviewed.  He has been screened for AAA.   Most recent fall risk assessment:    02/19/2022    4:04 PM  [place] in the past year? 0  Number falls in past yr: 0  Injury with Fall? 0  Risk for fall due to : Medication side effect  Follow up Falls prevention discussed;Education provided;Falls evaluation completed     Most recent depression screenings:    02/19/2022    4:04 PM 02/15/2021   10:41 AM  PHQ 2/9 Scores  PHQ - 2 Score 0 0  PHQ- 9 Score 0     Vision:Within last year and Dental: Receives regular dental care    Patient Care  Team: Denita Lung, MD as PCP - General (Family Medicine)   Outpatient Medications Prior to Visit  Medication Sig   naproxen sodium (ALEVE) 220 MG tablet Take 220 mg by mouth.   [DISCONTINUED] losartan (COZAAR) 50 MG tablet Take 1 tablet (50 mg total) by mouth daily.   [DISCONTINUED] rosuvastatin (CRESTOR) 20 MG tablet Take 1 tablet (20 mg total) by mouth daily.   acetaminophen (TYLENOL) 325 MG tablet Take 650 mg by mouth every 6 (six) hours as needed.   OMEPRAZOLE PO Take by mouth. (Patient not taking: Reported on 08/07/2021)   sildenafil (REVATIO) 20 MG tablet Take 1 to 5 holes daily as needed for erectile dysfunction (Patient not taking: Reported on 02/19/2022)   [DISCONTINUED] amoxicillin-clavulanate (AUGMENTIN) 875-125 MG tablet Take 1 tablet by mouth 2 (two) times daily. (Patient not taking: Reported on 02/19/2022)   [DISCONTINUED] BOOSTRIX 5-2.5-18.5 LF-MCG/0.5 injection  (Patient not taking: Reported on 08/07/2021)   No facility-administered medications prior to visit.    Review of Systems  All other systems reviewed and are negative.         Objective:     BP 138/84 Comment: patient machine 136/88-verified  Pulse 75   Resp 16   Ht 6' 2.5" (1.892 m)  Wt 215 lb (97.5 kg)   SpO2 97% Comment: room air  BMI 27.24 kg/m    Physical Exam  Alert and in no distress. Tympanic membranes and canals are normal. Pharyngeal area is normal. Neck is supple without adenopathy or thyromegaly. Cardiac exam shows a regular sinus rhythm without murmurs or gallops. Lungs are clear to auscultation. Hemoglobin A1c is 6.1 Results for orders placed or performed in visit on 02/20/22  POCT glycosylated hemoglobin (Hb A1C)  Result Value Ref Range   Hemoglobin A1C 6.1 (A) 4.0 - 5.6 %       Assessment & Plan:    Essential hypertension - Plan: losartan (COZAAR) 50 MG tablet  Seasonal allergic rhinitis due to pollen  Erectile dysfunction, unspecified erectile dysfunction type  History of  colonic polyps  Hyperlipidemia LDL goal <100 - Plan: Lipid panel, rosuvastatin (CRESTOR) 20 MG tablet, DISCONTINUED: rosuvastatin (CRESTOR) 20 MG tablet  Former smoker, stopped smoking in distant past  Screening for diabetes mellitus - Plan: POCT glycosylated hemoglobin (Hb A1C)  Contact with or exposure to communicable disease - Plan: RPR+HIV+GC+CT Panel  Stage 3a chronic kidney disease ([place])  Immunization History  Administered Date(s) Administered   DTaP 07/28/2001   Fluad Quad(high Dose 65+) 12/14/2018, 12/15/2019, 11/09/2021   Influenza Split 01/08/2011   Influenza Whole 01/01/2010   Influenza, High Dose Seasonal PF 11/24/2020   Influenza, Seasonal, Injecte, Preservative Fre 01/17/2012   Influenza,inj,Quad PF,6+ Mos 01/18/2013, 12/28/2013, 01/03/2015, 01/16/2016, 01/16/2017, 01/19/2018   Moderna SARS-COV2 Booster Vaccination 05/19/2020   Moderna Sars-Covid-2 Vaccination 03/11/2019, 04/08/2019, 12/10/2019   Pfizer Covid-19 Vaccine Bivalent Booster 62yr & up 11/24/2020   Pneumococcal Conjugate-13 01/13/2020   Pneumococcal Polysaccharide-23 12/14/2018   RSV,unspecified 11/18/2021   Tdap 01/08/2011, 03/01/2021   Unspecified SARS-COV-2 Vaccination 11/09/2021   Zoster Recombinat (Shingrix) 01/16/2017, 03/20/2017   Zoster, Live 12/28/2013    Health Maintenance  Topic Date Due   COVID-19 Vaccine (7 - 2023-24 season) 01/04/2022   Medicare Annual Wellness (AWV)  02/20/2023   COLONOSCOPY (Pts 45-457yrInsurance coverage will need to be confirmed)  11/24/2023   DTaP/Tdap/Td (4 - Td or Tdap) 03/02/2031   Pneumonia Vaccine 6558Years old  Completed   INFLUENZA VACCINE  Completed   Hepatitis C Screening  Completed   Zoster Vaccines- Shingrix  Completed   HPV VACCINES  Aged Out    Discussed health benefits of physical activity, and encouraged him to engage in regular exercise appropriate for his age and condition.  I discussed prediabetes with him in regard to diet and exercise.   Specifically recommending cutting back on carbohydrates.  Discussed prep with him but at this point he is not interested in that.  He is also to call his gastroenterologist to discuss when the next colonoscopy is to be done.  Continue on his allergy medicines as well as blood pressure and statin drug.  He will call when he needs a refill on his sildenafil.  Continue on OTC allergy medications.  I also discussed CKD with him.  He did bring his cuff in and it is accurate.  Recommend he check his blood pressure no more often than once a month.  Over 40 minutes spent discussing all these issues with him  Problem List Items Addressed This Visit     Allergic rhinitis due to pollen   Erectile dysfunction   Essential hypertension - Primary   Relevant Medications   losartan (COZAAR) 50 MG tablet   rosuvastatin (CRESTOR) 20 MG tablet   Former smoker, stopped  smoking in distant past   History of colonic polyps   Hyperlipidemia LDL goal <100   Relevant Medications   losartan (COZAAR) 50 MG tablet   rosuvastatin (CRESTOR) 20 MG tablet   Other Relevant Orders   Lipid panel   Stage 3a chronic kidney disease ([place])   Other Visit Diagnoses     Screening for diabetes mellitus       Relevant Orders   POCT glycosylated hemoglobin (Hb A1C) (Completed)   Contact with or exposure to communicable disease       Relevant Orders   RPR+HIV+GC+CT Panel      Follow-up 1 year    Jill Alexanders, MD

## 2022-02-22 ENCOUNTER — Encounter: Payer: Self-pay | Admitting: Gastroenterology

## 2022-02-23 LAB — RPR+HIV+GC+CT PANEL
Chlamydia trachomatis, NAA: NEGATIVE
HIV Screen 4th Generation wRfx: NONREACTIVE
Neisseria Gonorrhoeae by PCR: NEGATIVE
RPR Ser Ql: NONREACTIVE

## 2022-02-23 LAB — LIPID PANEL
Chol/HDL Ratio: 4 ratio (ref 0.0–5.0)
Cholesterol, Total: 143 mg/dL (ref 100–199)
HDL: 36 mg/dL — ABNORMAL LOW (ref >39)
LDL Chol Calc (NIH): 79 mg/dL (ref 0–99)
Triglycerides: 159 mg/dL — ABNORMAL HIGH (ref 0–149)
VLDL Cholesterol Cal: 28 mg/dL (ref 5–40)

## 2022-03-15 ENCOUNTER — Encounter: Payer: Self-pay | Admitting: Family Medicine

## 2022-03-18 ENCOUNTER — Encounter: Payer: Self-pay | Admitting: Family Medicine

## 2022-03-18 ENCOUNTER — Ambulatory Visit (INDEPENDENT_AMBULATORY_CARE_PROVIDER_SITE_OTHER): Payer: PPO | Admitting: Family Medicine

## 2022-03-18 VITALS — BP 148/82 | HR 78 | Temp 97.8°F | Resp 18 | Wt 217.6 lb

## 2022-03-18 DIAGNOSIS — L723 Sebaceous cyst: Secondary | ICD-10-CM

## 2022-03-18 NOTE — Progress Notes (Signed)
   Subjective:    Patient ID: Curtis Walker, male    DOB: 1955/02/14, 67 y.o.   MRN: [id number]  HPI He has a lump in the left perineal area that he has concerns over.   Review of Systems     Objective:   Physical Exam  Scrotum is normal.  Left perineal area does have a 1-1/2 cm lesion with central dimple.  Small amount of cheesy material was expressed.      Assessment & Plan:  Sebaceous cyst I explained that this is a sebaceous cyst and of no concern unless it gets infected.  If it does he should return here for removal.  He was comfortable with that.

## 2022-03-28 ENCOUNTER — Other Ambulatory Visit: Payer: Self-pay | Admitting: Family Medicine

## 2022-03-28 DIAGNOSIS — I10 Essential (primary) hypertension: Secondary | ICD-10-CM

## 2022-04-04 DIAGNOSIS — D2261 Melanocytic nevi of right upper limb, including shoulder: Secondary | ICD-10-CM | POA: Diagnosis not present

## 2022-04-04 DIAGNOSIS — D2271 Melanocytic nevi of right lower limb, including hip: Secondary | ICD-10-CM | POA: Diagnosis not present

## 2022-04-04 DIAGNOSIS — D225 Melanocytic nevi of trunk: Secondary | ICD-10-CM | POA: Diagnosis not present

## 2022-04-04 DIAGNOSIS — D485 Neoplasm of uncertain behavior of skin: Secondary | ICD-10-CM | POA: Diagnosis not present

## 2022-04-04 DIAGNOSIS — D2262 Melanocytic nevi of left upper limb, including shoulder: Secondary | ICD-10-CM | POA: Diagnosis not present

## 2022-04-04 DIAGNOSIS — L570 Actinic keratosis: Secondary | ICD-10-CM | POA: Diagnosis not present

## 2022-04-04 DIAGNOSIS — D2272 Melanocytic nevi of left lower limb, including hip: Secondary | ICD-10-CM | POA: Diagnosis not present

## 2022-04-04 DIAGNOSIS — L820 Inflamed seborrheic keratosis: Secondary | ICD-10-CM | POA: Diagnosis not present

## 2022-04-04 DIAGNOSIS — L821 Other seborrheic keratosis: Secondary | ICD-10-CM | POA: Diagnosis not present

## 2022-06-13 DIAGNOSIS — L820 Inflamed seborrheic keratosis: Secondary | ICD-10-CM | POA: Diagnosis not present

## 2022-07-01 DIAGNOSIS — G514 Facial myokymia: Secondary | ICD-10-CM | POA: Diagnosis not present

## 2022-07-01 DIAGNOSIS — H2513 Age-related nuclear cataract, bilateral: Secondary | ICD-10-CM | POA: Diagnosis not present

## 2022-07-16 ENCOUNTER — Ambulatory Visit (INDEPENDENT_AMBULATORY_CARE_PROVIDER_SITE_OTHER): Payer: PPO | Admitting: Medical

## 2022-07-16 ENCOUNTER — Encounter: Payer: Self-pay | Admitting: Medical

## 2022-07-16 VITALS — BP 128/80 | HR 78 | Temp 97.5°F | Ht 75.0 in | Wt 218.8 lb

## 2022-07-16 DIAGNOSIS — K4090 Unilateral inguinal hernia, without obstruction or gangrene, not specified as recurrent: Secondary | ICD-10-CM | POA: Diagnosis not present

## 2022-07-16 NOTE — Progress Notes (Signed)
Subjective:  Curtis Walker is a 67 y.o. male who presents for Chief Complaint  Patient presents with   Groin Swelling    Left groin swelling he noticed about a month ago. First thing in the morning it is not as bad.      For past month noticed bulge or swelling in left groin.   Not as bad in the morning.    No testicle pain, no change in urination, no blood in urine, no change in semen.   No swelling of testicle.   Walks a lot.  Mows and some yard work.   Doesn't strain a lot on the toilet.  No other aggravating or relieving factors.    No other c/o.  The following portions of the patient's history were reviewed and updated as appropriate: allergies, current medications, past family history, past medical history, past social history, past surgical history and problem list.  ROS Otherwise as in subjective above  Objective: BP 128/80   Pulse 78   Temp (!) 97.5 F (36.4 C) (Tympanic)   Ht 6\' 3"  (1.905 m)   Wt 218 lb 12.8 oz (99.2 kg)   SpO2 98%   BMI 27.35 kg/m   General appearance: alert, no distress, well developed, well nourished Left inguinal region with a moderate reducible nontender hernia, otherwise normal male, circ, normal testes, no other abnormality of GU    Assessment: Encounter Diagnosis  Name Primary?   Non-recurrent unilateral inguinal hernia without obstruction or gangrene Yes     Plan: Discussed findings, and diagnosis of left nontender inguinal hernia.  Discussed risks, possible complications but currently not causing pain.    I asked him to let us know when he would like referral to general surgery for consult   Curtis Walker was seen today for groin swelling.  Diagnoses and all orders for this visit:  Non-recurrent unilateral inguinal hernia without obstruction or gangrene    Follow up: pending call back

## 2022-07-16 NOTE — Patient Instructions (Signed)
Inguinal Hernia, Adult An inguinal hernia is when fat or your intestines push through a weak spot in a muscle where your leg meets your lower belly (groin). This causes a bulge. This kind of hernia could also be: In your scrotum, if you are male. In folds of skin around your vagina, if you are male. There are three types of inguinal hernias: Hernias that can be pushed back into the belly (are reducible). This type rarely causes pain. Hernias that cannot be pushed back into the belly (are incarcerated). Hernias that cannot be pushed back into the belly and lose their blood supply (are strangulated). This type needs emergency surgery. What are the causes? This condition is caused by having a weak spot in the muscles or tissues in your groin. This develops over time. The hernia may poke through the weak spot when you strain your lower belly muscles all of a sudden, such as when you: Lift a heavy object. Strain to poop (have a bowel movement). Trouble pooping (constipation) can lead to straining. Cough. What increases the risk? This condition is more likely to develop in: Males. Pregnant females. People who: Are overweight. Work in jobs that require long periods of standing or heavy lifting. Have had an inguinal hernia before. Smoke or have lung disease. These factors can lead to long-term (chronic) coughing. What are the signs or symptoms? Symptoms may depend on the size of the hernia. Often, a small hernia has no symptoms. Symptoms of a larger hernia may include: A bulge in the groin area. This is easier to see when standing. You might not be able to see it when you are lying down. Pain or burning in the groin. This may get worse when you lift, strain, or cough. A dull ache or a feeling of pressure in the groin. An abnormal bulge in the scrotum, in males. Symptoms of a strangulated inguinal hernia may include: A bulge in your groin that is very painful and tender to the touch. A bulge  that turns red or purple. Fever, feeling like you may vomit (nausea), and vomiting. Not being able to poop or to pass gas. How is this treated? Treatment depends on the size of your hernia and whether you have symptoms. If you do not have symptoms, your doctor may have you watch your hernia carefully and have you come in for follow-up visits. If your hernia is large or if you have symptoms, you may need surgery to repair the hernia. Follow these instructions at home: Lifestyle Avoid lifting heavy objects. Avoid standing for long amounts of time. Do not smoke or use any products that contain nicotine or tobacco. If you need help quitting, ask your doctor. Stay at a healthy weight. Prevent trouble pooping You may need to take these actions to prevent or treat trouble pooping: Drink enough fluid to keep your pee (urine) pale yellow. Take over-the-counter or prescription medicines. Eat foods that are high in fiber. These include beans, whole grains, and fresh fruits and vegetables. Limit foods that are high in fat and sugar. These include fried or sweet foods. General instructions You may try to push your hernia back in place by very gently pressing on it when you are lying down. Do not try to push the bulge back in if it will not go in easily. Watch your hernia for any changes in shape, size, or color. Tell your doctor if you see any changes. Take over-the-counter and prescription medicines only as told by your doctor. Keep  all follow-up visits. Contact a doctor if: You have a fever or chills. You have new symptoms. Your symptoms get worse. Get help right away if: You have pain in your groin that gets worse all of a sudden. You have a bulge in your groin that: Gets bigger all of a sudden, and it does not get smaller after that. Turns red or purple. Is painful when you touch it. You are a male, and you have: Sudden pain in your scrotum. A sudden change in the size of your scrotum. You  cannot push the hernia back in place by very gently pressing on it when you are lying down. You feel like you may vomit, and that feeling does not go away. You keep vomiting. You have a fast heartbeat. You cannot poop or pass gas. These symptoms may be an emergency. Get help right away. Call your local emergency services (911 in the [place] Do not wait to see if the symptoms will go away. Do not drive yourself to the hospital. Summary An inguinal hernia is when fat or your intestines push through a weak spot in a muscle where your leg meets your lower belly (groin). This causes a bulge. If you do not have symptoms, you may not need treatment. If you have symptoms or a large hernia, you may need surgery. Avoid lifting heavy objects. Also, avoid standing for long amounts of time. Do not try to push the bulge back in if it will not go in easily. This information is not intended to replace advice given to you by your health care provider. Make sure you discuss any questions you have with your health care provider. Document Revised: 09/28/2019 Document Reviewed: 09/28/2019 Elsevier Patient Education  2024 [employer].

## 2022-07-18 ENCOUNTER — Other Ambulatory Visit: Payer: Self-pay | Admitting: Medical

## 2022-07-18 DIAGNOSIS — K4090 Unilateral inguinal hernia, without obstruction or gangrene, not specified as recurrent: Secondary | ICD-10-CM

## 2022-07-25 ENCOUNTER — Ambulatory Visit (INDEPENDENT_AMBULATORY_CARE_PROVIDER_SITE_OTHER): Payer: PPO | Admitting: Surgery

## 2022-07-25 ENCOUNTER — Encounter: Payer: Self-pay | Admitting: Surgery

## 2022-07-25 VITALS — BP 150/90 | HR 85 | Temp 98.0°F | Ht 75.0 in | Wt 217.0 lb

## 2022-07-25 DIAGNOSIS — K4020 Bilateral inguinal hernia, without obstruction or gangrene, not specified as recurrent: Secondary | ICD-10-CM | POA: Diagnosis not present

## 2022-07-25 NOTE — Patient Instructions (Addendum)
We discussed having your inguinal hernia repaired. This would be done by Dr. Claudine Mouton at [facility].  If you decide to have this done please call us to get this set up.  See surgery information below.    Please see your (blue) Pre-care information that you have been given today. Our surgery scheduler will call you to verify surgery date and to go over information.   You would need to arrange to be out of work for approximately 1-2 weeks and then you may return with a lifting restriction for 4 more weeks. If you have FMLA or Disability paperwork that needs to be filled out, please have your company fax your paperwork to [phone] or you may drop this by either office. This paperwork will be filled out within 3 days after your surgery has been completed.  You may have a bruise in your groin and also swelling and brusing in your testicle area. You may use ice 4-5 times daily for 15-20 minutes each time. Make sure that you place a barrier between you and the ice pack. To decrease the swelling, you may roll up a bath towel and place it vertically in between your thighs with your testicles resting on the towel. You will want to keep this area elevated as much as possible for several days following surgery.    Inguinal Hernia, Adult Muscles help keep everything in the body in its proper place. But if a weak spot in the muscles develops, something can poke through. That is called a hernia. When this happens in the lower part of the belly (abdomen), it is called an inguinal hernia. (It takes its name from a part of the body in this region called the inguinal canal.) A weak spot in the wall of muscles lets some fat or part of the small intestine bulge through. An inguinal hernia can develop at any age. Men get them more often than women. CAUSES  In adults, an inguinal hernia develops over time. It can be triggered by: Suddenly straining the muscles of the lower abdomen. Lifting heavy objects. Straining  to have a bowel movement. Difficult bowel movements (constipation) can lead to this. Constant coughing. This may be caused by smoking or lung disease. Being overweight. Being pregnant. Working at a job that requires long periods of standing or heavy lifting. Having had an inguinal hernia before. One type can be an emergency situation. It is called a strangulated inguinal hernia. It develops if part of the small intestine slips through the weak spot and cannot get back into the abdomen. The blood supply can be cut off. If that happens, part of the intestine may die. This situation requires emergency surgery. SYMPTOMS  Often, a small inguinal hernia has no symptoms. It is found when a healthcare provider does a physical exam. Larger hernias usually have symptoms.  In adults, symptoms may include: A lump in the groin. This is easier to see when the person is standing. It might disappear when lying down. In men, a lump in the scrotum. Pain or burning in the groin. This occurs especially when lifting, straining or coughing. A dull ache or feeling of pressure in the groin. Signs of a strangulated hernia can include: A bulge in the groin that becomes very painful and tender to the touch. A bulge that turns red or purple. Fever, nausea and vomiting. Inability to have a bowel movement or to pass gas. DIAGNOSIS  To decide if you have an inguinal hernia, a healthcare provider  will probably do a physical examination. This will include asking questions about any symptoms you have noticed. The healthcare provider might feel the groin area and ask you to cough. If an inguinal hernia is felt, the healthcare provider may try to slide it back into the abdomen. Usually no other tests are needed. TREATMENT  Treatments can vary. The size of the hernia makes a difference. Options include: Watchful waiting. This is often suggested if the hernia is small and you have had no symptoms. No medical procedure will be  done unless symptoms develop. You will need to watch closely for symptoms. If any occur, contact your healthcare provider right away. Surgery. This is used if the hernia is larger or you have symptoms. Open surgery. This is usually an outpatient procedure (you will not stay overnight in a hospital). An cut (incision) is made through the skin in the groin. The hernia is put back inside the abdomen. The weak area in the muscles is then repaired by herniorrhaphy or hernioplasty. Herniorrhaphy: in this type of surgery, the weak muscles are sewn back together. Hernioplasty: a patch or mesh is used to close the weak area in the abdominal wall. Laparoscopy. In this procedure, a surgeon makes small incisions. A thin tube with a tiny video camera (called a laparoscope) is put into the abdomen. The surgeon repairs the hernia with mesh by looking with the video camera and using two long instruments. HOME CARE INSTRUCTIONS  After surgery to repair an inguinal hernia: You will need to take pain medicine prescribed by your healthcare provider. Follow all directions carefully. You will need to take care of the wound from the incision. Your activity will be restricted for awhile. This will probably include no heavy lifting for several weeks. You also should not do anything too active for a few weeks. When you can return to work will depend on the type of job that you have. During "watchful waiting" periods, you should: Maintain a healthy weight. Eat a diet high in fiber (fruits, vegetables and whole grains). Drink plenty of fluids to avoid constipation. This means drinking enough water and other liquids to keep your urine clear or pale yellow. Do not lift heavy objects. Do not stand for long periods of time. Quit smoking. This should keep you from developing a frequent cough. SEEK MEDICAL CARE IF:  A bulge develops in your groin area. You feel pain, a burning sensation or pressure in the groin. This might be  worse if you are lifting or straining. You develop a fever of more than 100.5 F (38.1 C). SEEK IMMEDIATE MEDICAL CARE IF:  Pain in the groin increases suddenly. A bulge in the groin gets bigger suddenly and does not go down. For men, there is sudden pain in the scrotum. Or, the size of the scrotum increases. A bulge in the groin area becomes red or purple and is painful to touch. You have nausea or vomiting that does not go away. You feel your heart beating much faster than normal. You cannot have a bowel movement or pass gas. You develop a fever of more than 102.0 F (38.9 C).   This information is not intended to replace advice given to you by your health care provider. Make sure you discuss any questions you have with your health care provider.   Document Released: 06/16/2008 Document Revised: 04/22/2011 Document Reviewed: 08/01/2014 Elsevier Interactive Patient Education [employer]

## 2022-07-25 NOTE — H&P (View-Only) (Signed)
Patient ID: Curtis Walker, male   DOB: 09/06/1955, 67 y.o.   MRN: [id number]  Chief Complaint: Left inguinal hernia  History of Present Illness Curtis Walker is a 67 y.o. male with left inguinal hernia essentially becoming more noticeable over the last month.  Previously known but never a concern.  Had no pain, nor discomfort.  Reports it is readily reducible when recumbent.  He noted an asymmetry, and a bulging present.  That this is recurring, mildly alarming but clearly putting him in no distress.  Past Medical History Past Medical History:  Diagnosis Date   Allergy    RHINITIS   Dyslipidemia    History of colonic polyps       Past Surgical History:  Procedure Laterality Date   COLONOSCOPY  2006   Dr. Hung   COLONOSCOPY WITH PROPOFOL N/A 11/24/2018   Procedure: COLONOSCOPY WITH PROPOFOL;  Surgeon: Wohl, Darren, MD;  Location: [facility] ENDOSCOPY;  Service: Endoscopy;  Laterality: N/A;   fracture repair right humerus      Allergies  Allergen Reactions   Codeine Itching    Current Outpatient Medications  Medication Sig Dispense Refill   losartan (COZAAR) 50 MG tablet Take 1 tablet (50 mg total) by mouth daily. 90 tablet 3   OMEPRAZOLE PO Take by mouth.     rosuvastatin (CRESTOR) 20 MG tablet Take 1 tablet (20 mg total) by mouth daily. 90 tablet 3   sildenafil (REVATIO) 20 MG tablet Take 1 to 5 holes daily as needed for erectile dysfunction 90 tablet 5   No current facility-administered medications for this visit.    Family History Family History  Problem Relation Age of Onset   Asthma Mother    Mental illness Mother    Arthritis Father    Hypertension Father    Hypertension Paternal Grandfather    Heart disease Paternal Grandfather       Social History Social History   Tobacco Use   Smoking status: Former    Types: Cigarettes    Quit date: 01/17/1991    Years since quitting: 31.5    Passive exposure: Past   Smokeless tobacco: Never  Vaping Use   Vaping  Use: Never used  Substance Use Topics   Alcohol use: Yes    Comment: rare   Drug use: No        Review of Systems  Constitutional: Negative.   HENT: Negative.    Eyes: Negative.   Respiratory: Negative.    Cardiovascular: Negative.   Gastrointestinal: Negative.   Genitourinary: Negative.   Skin: Negative.   Neurological: Negative.      Physical Exam Blood pressure (!) 150/90, pulse 85, temperature 98 F (36.7 C), height 6' 3" (1.905 m), weight 217 lb (98.4 kg), SpO2 95 %. Last Weight  Most recent update: 07/25/2022  9:00 AM    Weight  98.4 kg (217 lb)             CONSTITUTIONAL: Well developed, and nourished, appropriately responsive and aware without distress.   EYES: Sclera non-icteric.   EARS, NOSE, MOUTH AND THROAT:  The oropharynx is clear. Oral mucosa is pink and moist.     Hearing is intact to voice.  NECK: Trachea is midline, and there is no jugular venous distension.  LYMPH NODES:  Lymph nodes in the neck are not appreciated. RESPIRATORY:  Lungs are clear, and breath sounds are equal bilaterally.  Normal respiratory effort without pathologic use of accessory muscles. CARDIOVASCULAR: Heart is regular in   rate and rhythm.   Well perfused.  GI: The abdomen is  soft, nontender, and nondistended. There were no palpable masses.  I did not appreciate hepatosplenomegaly.  GU: Noticeable obvious asymmetry in the perineum.  Readily appreciated left groin hernia, there is also a right inguinal hernia as well on Valsalva.  Testes are descended bilaterally.  No other remarkable issues. MUSCULOSKELETAL:  Symmetrical muscle tone appreciated in all four extremities.    SKIN: Skin turgor is normal. No pathologic skin lesions appreciated.  NEUROLOGIC:  Motor and sensation appear grossly normal.  Cranial nerves are grossly without defect. PSYCH:  Alert and oriented to person, place and time. Affect is appropriate for situation.  Data Reviewed I have personally reviewed what is  currently available of the patient's imaging, recent labs and medical records.   Labs:     Latest Ref Rng & Units 02/13/2022    7:25 PM 02/15/2021   11:38 AM 12/15/2019    3:05 PM  CBC  WBC 4.0 - 10.5 K/uL 11.8  9.2  9.9   Hemoglobin 13.0 - 17.0 g/dL 14.6  15.0  15.7   Hematocrit 39.0 - 52.0 % 43.2  42.1  47.1   Platelets 150 - 400 K/uL 321  294  294       Latest Ref Rng & Units 02/13/2022    7:25 PM 02/15/2021   11:38 AM 12/15/2019    3:05 PM  CMP  Glucose 70 - 99 mg/dL 123  108  98   BUN 8 - 23 mg/dL 28  26  22   Creatinine 0.61 - 1.24 mg/dL 1.42  1.39  1.33   Sodium 135 - 145 mmol/L 139  139  142   Potassium 3.5 - 5.1 mmol/L 3.7  4.2  4.6   Chloride 98 - 111 mmol/L 102  103  103   CO2 22 - 32 mmol/L 27  24  25   Calcium 8.9 - 10.3 mg/dL 9.6  9.5  9.9   Total Protein 6.0 - 8.5 g/dL  7.7  7.7   Total Bilirubin 0.0 - 1.2 mg/dL  0.6  0.7   Alkaline Phos 44 - 121 IU/L  89  99   AST 0 - 40 IU/L  33  37   ALT 0 - 44 IU/L  41  45       Imaging: Radiological images reviewed:   Within last 24 hrs: No results found.  Assessment    Bilateral inguinal hernias. Patient Active Problem List   Diagnosis Date Noted   Stage 3a chronic kidney disease (HCC) 02/20/2022   Essential hypertension 12/14/2018   Peyronie's disease 12/14/2018   Diverticula of colon 02/26/2018   Former smoker, stopped smoking in distant past 01/16/2017   Erectile dysfunction 01/16/2017   History of colonic polyps 01/03/2015   Allergic rhinitis due to pollen 01/08/2011   Hyperlipidemia LDL goal <100 01/08/2011    Plan    We discussed the role of robotic bilateral inguinal hernia repairs.  The risks inherent with them.  The probable progression of the disease process, its elective nature, and what to watch for.  Is currently indecisive whether to proceed with elective hernia repair at this time ordered to defer, will defer for the time being.  He may follow-up as he wishes, I will be glad to see this pleasant  gentleman  I discussed possibility of incarceration, strangulation, enlargement in size over time, and the need for emergency surgery in the face of these.    Also reviewed the techniques of reduction should incarceration occur, and when unsuccessful to present to the ED.  Also discussed that surgery risks include recurrence which can be up to 30% in the case of complex hernias, use of prosthetic materials (mesh) and the increased risk of infection and the possible need for re-operation and removal of mesh, possibility of post-op SBO or ileus, and the risks of general anesthetic including heart attack, stroke, sudden death or some reaction to anesthetic medications. The patient, and those present, appear to understand the risks, any and all questions were answered to the patient's satisfaction.  No guarantees were ever expressed or implied.   Face-to-face time spent with the patient and accompanying care providers(if present) was 35 minutes, with more than 50% of the time spent counseling, educating, and coordinating care of the patient.    These notes generated with voice recognition software. I apologize for typographical errors.  Alanna Storti M.D., FACS 07/26/2022, 7:21 PM     

## 2022-07-25 NOTE — Progress Notes (Signed)
Patient ID: Curtis Walker, male   DOB: Dec 05, 1955, 67 y.o.   MRN: [id number]  Chief Complaint: Left inguinal hernia  History of Present Illness [name] is a 67 y.o. male with left inguinal hernia essentially becoming more noticeable over the last month.  Previously known but never a concern.  Had no pain, nor discomfort.  Reports it is readily reducible when recumbent.  He noted an asymmetry, and a bulging present.  That this is recurring, mildly alarming but clearly putting him in no distress.  Past Medical History Past Medical History:  Diagnosis Date   Allergy    RHINITIS   Dyslipidemia    History of colonic polyps       Past Surgical History:  Procedure Laterality Date   COLONOSCOPY  2006   Dr. Elnoria Howard   COLONOSCOPY WITH PROPOFOL N/A 11/24/2018   Procedure: COLONOSCOPY WITH PROPOFOL;  Surgeon: Midge Minium, MD;  Location[facility] ENDOSCOPY;  Service: Endoscopy;  Laterality: N/A;   fracture repair right humerus      Allergies  Allergen Reactions   Codeine Itching    Current Outpatient Medications  Medication Sig Dispense Refill   losartan (COZAAR) 50 MG tablet Take 1 tablet (50 mg total) by mouth daily. 90 tablet 3   OMEPRAZOLE PO Take by mouth.     rosuvastatin (CRESTOR) 20 MG tablet Take 1 tablet (20 mg total) by mouth daily. 90 tablet 3   sildenafil (REVATIO) 20 MG tablet Take 1 to 5 holes daily as needed for erectile dysfunction 90 tablet 5   No current facility-administered medications for this visit.    Family History Family History  Problem Relation Age of Onset   Asthma Mother    Mental illness Mother    Arthritis Father    Hypertension Father    Hypertension Paternal Grandfather    Heart disease Paternal Grandfather       Social History Social History   Tobacco Use   Smoking status: Former    Types: Cigarettes    Quit date: 01/17/1991    Years since quitting: 31.5    Passive exposure: Past   Smokeless tobacco: Never  Vaping Use   Vaping  Use: Never used  Substance Use Topics   Alcohol use: Yes    Comment: rare   Drug use: No        Review of Systems  Constitutional: Negative.   HENT: Negative.    Eyes: Negative.   Respiratory: Negative.    Cardiovascular: Negative.   Gastrointestinal: Negative.   Genitourinary: Negative.   Skin: Negative.   Neurological: Negative.      Physical Exam Blood pressure (!) 150/90, pulse 85, temperature 98 F (36.7 C), height 6\' 3"  (1.905 m), weight 217 lb (98.4 kg), SpO2 95 %. Last Weight  Most recent update: 07/25/2022  9:00 AM    Weight  98.4 kg (217 lb)             CONSTITUTIONAL: Well developed, and nourished, appropriately responsive and aware without distress.   EYES: Sclera non-icteric.   EARS, NOSE, MOUTH AND THROAT:  The oropharynx is clear. Oral mucosa is pink and moist.     Hearing is intact to voice.  NECK: Trachea is midline, and there is no jugular venous distension.  LYMPH NODES:  Lymph nodes in the neck are not appreciated. RESPIRATORY:  Lungs are clear, and breath sounds are equal bilaterally.  Normal respiratory effort without pathologic use of accessory muscles. CARDIOVASCULAR: Heart is regular in  rate and rhythm.   Well perfused.  GI: The abdomen is  soft, nontender, and nondistended. There were no palpable masses.  I did not appreciate hepatosplenomegaly.  GU: Noticeable obvious asymmetry in the perineum.  Readily appreciated left groin hernia, there is also a right inguinal hernia as well on Valsalva.  Testes are descended bilaterally.  No other remarkable issues. MUSCULOSKELETAL:  Symmetrical muscle tone appreciated in all four extremities.    SKIN: Skin turgor is normal. No pathologic skin lesions appreciated.  NEUROLOGIC:  Motor and sensation appear grossly normal.  Cranial nerves are grossly without defect. PSYCH:  Alert and oriented to person, place and time. Affect is appropriate for situation.  Data Reviewed I have personally reviewed what is  currently available of the patient's imaging, recent labs and medical records.   Labs:     Latest Ref Rng & Units 02/13/2022    7:25 PM 02/15/2021   11:38 AM 12/15/2019    3:05 PM  CBC  WBC 4.0 - 10.5 K/uL 11.8  9.2  9.9   Hemoglobin 13.0 - 17.0 g/dL 16.1  09.6  04.5   Hematocrit 39.0 - 52.0 % 43.2  42.1  47.1   Platelets 150 - 400 K/uL 321  294  294       Latest Ref Rng & Units 02/13/2022    7:25 PM 02/15/2021   11:38 AM 12/15/2019    3:05 PM  CMP  Glucose 70 - 99 mg/dL 409  811  98   BUN 8 - 23 mg/dL 28  26  22    Creatinine 0.61 - 1.24 mg/dL 9.14  7.82  9.56   Sodium 135 - 145 mmol/L 139  139  142   Potassium 3.5 - 5.1 mmol/L 3.7  4.2  4.6   Chloride 98 - 111 mmol/L 102  103  103   CO2 22 - 32 mmol/L 27  24  25    Calcium 8.9 - 10.3 mg/dL 9.6  9.5  9.9   Total Protein 6.0 - 8.5 g/dL  7.7  7.7   Total Bilirubin 0.0 - 1.2 mg/dL  0.6  0.7   Alkaline Phos 44 - 121 IU/L  89  99   AST 0 - 40 IU/L  33  37   ALT 0 - 44 IU/L  41  45       Imaging: Radiological images reviewed:   Within last 24 hrs: No results found.  Assessment    Bilateral inguinal hernias. Patient Active Problem List   Diagnosis Date Noted   Stage 3a chronic kidney disease (HCC) 02/20/2022   Essential hypertension 12/14/2018   Peyronie's disease 12/14/2018   Diverticula of colon 02/26/2018   Former smoker, stopped smoking in distant past 01/16/2017   Erectile dysfunction 01/16/2017   History of colonic polyps 01/03/2015   Allergic rhinitis due to pollen 01/08/2011   Hyperlipidemia LDL goal <100 01/08/2011    Plan    We discussed the role of robotic bilateral inguinal hernia repairs.  The risks inherent with them.  The probable progression of the disease process, its elective nature, and what to watch for.  Is currently indecisive whether to proceed with elective hernia repair at this time ordered to defer, will defer for the time being.  He may follow-up as he wishes, I will be glad to see this pleasant  gentleman  I discussed possibility of incarceration, strangulation, enlargement in size over time, and the need for emergency surgery in the face of these.  Also reviewed the techniques of reduction should incarceration occur, and when unsuccessful to present to the ED.  Also discussed that surgery risks include recurrence which can be up to 30% in the case of complex hernias, use of prosthetic materials (mesh) and the increased risk of infection and the possible need for re-operation and removal of mesh, possibility of post-op SBO or ileus, and the risks of general anesthetic including heart attack, stroke, sudden death or some reaction to anesthetic medications. The patient, and those present, appear to understand the risks, any and all questions were answered to the patient's satisfaction.  No guarantees were ever expressed or implied.   Face-to-face time spent with the patient and accompanying care providers(if present) was 35 minutes, with more than 50% of the time spent counseling, educating, and coordinating care of the patient.    These notes generated with voice recognition software. I apologize for typographical errors.  Campbell Lerner M.D., FACS 07/26/2022, 7:21 PM

## 2022-07-26 DIAGNOSIS — K4020 Bilateral inguinal hernia, without obstruction or gangrene, not specified as recurrent: Secondary | ICD-10-CM | POA: Insufficient documentation

## 2022-07-30 ENCOUNTER — Telehealth: Payer: Self-pay | Admitting: Surgery

## 2022-07-30 ENCOUNTER — Ambulatory Visit: Payer: Self-pay | Admitting: Surgery

## 2022-07-30 DIAGNOSIS — K4020 Bilateral inguinal hernia, without obstruction or gangrene, not specified as recurrent: Secondary | ICD-10-CM

## 2022-07-30 NOTE — Telephone Encounter (Signed)
Patient has been advised of Pre-Admission date/time, and Surgery date at [facility].  Surgery Date: 08/07/22 Preadmission Testing Date: 08/01/22 (phone 1p- 4p)   Patient has been made aware to call [phone], between 1-3:00pm the day before surgery, to find out what time to arrive for surgery.

## 2022-08-01 ENCOUNTER — Encounter
Admission: RE | Admit: 2022-08-01 | Discharge: 2022-08-01 | Disposition: A | Discharge status: Home / Self Care | Payer: PPO | Source: Ambulatory Visit | Attending: Surgery | Admitting: Surgery

## 2022-08-01 ENCOUNTER — Other Ambulatory Visit: Payer: Self-pay

## 2022-08-01 VITALS — Ht 75.0 in | Wt 215.0 lb

## 2022-08-01 DIAGNOSIS — I10 Essential (primary) hypertension: Secondary | ICD-10-CM

## 2022-08-01 NOTE — Patient Instructions (Addendum)
Your procedure is scheduled on: Wednesday August 07, 2022. Report to the Registration Desk on the 1st floor of the [facility]. To find out your arrival time, please call [phone] between 1PM - 3PM on: Tuesday August 06, 2022. If your arrival time is 6:00 am, do not arrive before that time as the Medical Mall entrance doors do not open until 6:00 am.  REMEMBER: Instructions that are not followed completely may result in serious medical risk, up to and including death; or upon the discretion of your surgeon and anesthesiologist your surgery may need to be rescheduled.  Do not eat food or drink fluids after midnight the night before surgery.  No gum chewing or hard candies.   One week prior to surgery: Stop Anti-inflammatories (NSAIDS) such as Advil, Aleve, Ibuprofen, Motrin, Naproxen, Naprosyn and Aspirin based products such as Excedrin, Goody's Powder, BC Powder. Stop ALL OVER THE COUNTER supplements until after surgery. GINSENG PO  Magnesium 250 MG  vitamin E  Zinc 50 MG   You may however, continue to take Tylenol if needed for pain up until the day of surgery.  Continue taking all prescribed medications with the exception of the following:   Follow recommendations from Cardiologist or PCP regarding stopping blood thinners.  TAKE ONLY THESE MEDICATIONS THE MORNING OF SURGERY WITH A SIP OF WATER:  OMEPRAZOLE PO Antacid (take one the night before and one on the morning of surgery - helps to prevent nausea after surgery.) rosuvastatin (CRESTOR) 20 MG    No Alcohol for 24 hours before or after surgery.  No Smoking including e-cigarettes for 24 hours before surgery.  No chewable tobacco products for at least 6 hours before surgery.  No nicotine patches on the day of surgery.  Do not use any "recreational" drugs for at least a week (preferably 2 weeks) before your surgery.  Please be advised that the combination of cocaine and anesthesia may have negative outcomes, up to and  including death. If you test positive for cocaine, your surgery will be cancelled.  On the morning of surgery brush your teeth with toothpaste and water, you may rinse your mouth with mouthwash if you wish. Do not swallow any toothpaste or mouthwash.  Use CHG Soap or wipes as directed on instruction sheet.  Do not wear jewelry, make-up, hairpins, clips or nail polish.  Do not wear lotions, powders, or perfumes.   Do not shave body hair from the neck down 48 hours before surgery.  Contact lenses, hearing aids and dentures may not be worn into surgery.  Do not bring valuables to the hospital. [facility] is not responsible for any missing/lost belongings or valuables.   Notify your doctor if there is any change in your medical condition (cold, fever, infection).  Wear comfortable clothing (specific to your surgery type) to the hospital.  After surgery, you can help prevent lung complications by doing breathing exercises.  Take deep breaths and cough every 1-2 hours. Your doctor may order a device called an Incentive Spirometer to help you take deep breaths. When coughing or sneezing, hold a pillow firmly against your incision with both hands. This is called "splinting." Doing this helps protect your incision. It also decreases belly discomfort.  If you are being admitted to the hospital overnight, leave your suitcase in the car. After surgery it may be brought to your room.  In case of increased patient census, it may be necessary for you, the patient, to continue your postoperative care in the  Same Day Surgery department.  If you are being discharged the day of surgery, you will not be allowed to drive home. You will need a responsible individual to drive you home and stay with you for 24 hours after surgery.   If you are taking public transportation, you will need to have a responsible individual with you.  Please call the Pre-admissions Testing Dept. at [phone] if you have  any questions about these instructions.  Surgery Visitation Policy:  Patients having surgery or a procedure may have two visitors.  Children under the age of 31 must have an adult with them who is not the patient.  Inpatient Visitation:    Visiting hours are 7 a.m. to 8 p.m. Up to four visitors are allowed at one time in a patient room. The visitors may rotate out with other people during the day.  One visitor age 26 or older may stay with the patient overnight and must be in the room by 8 p.m.    Preparing for Surgery with CHLORHEXIDINE GLUCONATE (CHG) Soap  Chlorhexidine Gluconate (CHG) Soap  o An antiseptic cleaner that kills germs and bonds with the skin to continue killing germs even after washing  o Used for showering the night before surgery and morning of surgery  Before surgery, you can play an important role by reducing the number of germs on your skin.  CHG (Chlorhexidine gluconate) soap is an antiseptic cleanser which kills germs and bonds with the skin to continue killing germs even after washing.  Please do not use if you have an allergy to CHG or antibacterial soaps. If your skin becomes reddened/irritated stop using the CHG.  1. Shower the NIGHT BEFORE SURGERY and the MORNING OF SURGERY with CHG soap.  2. If you choose to wash your hair, wash your hair first as usual with your normal shampoo.  3. After shampooing, rinse your hair and body thoroughly to remove the shampoo.  4. Use CHG as you would any other liquid soap. You can apply CHG directly to the skin and wash gently with a scrungie or a clean washcloth.  5. Apply the CHG soap to your body only from the neck down. Do not use on open wounds or open sores. Avoid contact with your eyes, ears, mouth, and genitals (private parts). Wash face and genitals (private parts) with your normal soap.  6. Wash thoroughly, paying special attention to the area where your surgery will be performed.  7. Thoroughly rinse  your body with warm water.  8. Do not shower/wash with your normal soap after using and rinsing off the CHG soap.  9. Pat yourself dry with a clean towel.  10. Wear clean pajamas to bed the night before surgery.  12. Place clean sheets on your bed the night of your first shower and do not sleep with pets.  13. Shower again with the CHG soap on the day of surgery prior to arriving at the hospital.  14. Do not apply any deodorants/lotions/powders.  15. Please wear clean clothes to the hospital.

## 2022-08-02 ENCOUNTER — Encounter
Admission: RE | Admit: 2022-08-02 | Discharge: 2022-08-02 | Disposition: A | Discharge status: Home / Self Care | Payer: PPO | Source: Ambulatory Visit | Attending: Surgery | Admitting: Surgery

## 2022-08-02 DIAGNOSIS — Z01818 Encounter for other preprocedural examination: Secondary | ICD-10-CM | POA: Insufficient documentation

## 2022-08-02 DIAGNOSIS — K4020 Bilateral inguinal hernia, without obstruction or gangrene, not specified as recurrent: Secondary | ICD-10-CM | POA: Diagnosis not present

## 2022-08-02 DIAGNOSIS — I10 Essential (primary) hypertension: Secondary | ICD-10-CM

## 2022-08-02 DIAGNOSIS — Z01810 Encounter for preprocedural cardiovascular examination: Secondary | ICD-10-CM | POA: Diagnosis not present

## 2022-08-02 LAB — CBC WITH DIFFERENTIAL/PLATELET
Abs Immature Granulocytes: 0.04 10*3/uL (ref 0.00–0.07)
Basophils Absolute: 0.1 10*3/uL (ref 0.0–0.1)
Basophils Relative: 1 %
Eosinophils Absolute: 0.1 10*3/uL (ref 0.0–0.5)
Eosinophils Relative: 1 %
HCT: 42.3 % (ref 39.0–52.0)
Hemoglobin: 14.5 g/dL (ref 13.0–17.0)
Immature Granulocytes: 1 %
Lymphocytes Relative: 23 %
Lymphs Abs: 2.1 10*3/uL (ref 0.7–4.0)
MCH: 29.5 pg (ref 26.0–34.0)
MCHC: 34.3 g/dL (ref 30.0–36.0)
MCV: 86.2 fL (ref 80.0–100.0)
Monocytes Absolute: 0.4 10*3/uL (ref 0.1–1.0)
Monocytes Relative: 4 %
Neutro Abs: 6.2 10*3/uL (ref 1.7–7.7)
Neutrophils Relative %: 70 %
Platelets: 286 10*3/uL (ref 150–400)
RBC: 4.91 MIL/uL (ref 4.22–5.81)
RDW: 13.1 % (ref 11.5–15.5)
WBC: 8.9 10*3/uL (ref 4.0–10.5)
nRBC: 0 % (ref 0.0–0.2)

## 2022-08-02 LAB — COMPREHENSIVE METABOLIC PANEL
ALT: 38 U/L (ref 0–44)
AST: 37 U/L (ref 15–41)
Albumin: 4.4 g/dL (ref 3.5–5.0)
Alkaline Phosphatase: 83 U/L (ref 38–126)
Anion gap: 11 (ref 5–15)
BUN: 26 mg/dL — ABNORMAL HIGH (ref 8–23)
CO2: 23 mmol/L (ref 22–32)
Calcium: 9.2 mg/dL (ref 8.9–10.3)
Chloride: 102 mmol/L (ref 98–111)
Creatinine, Ser: 1.28 mg/dL — ABNORMAL HIGH (ref 0.61–1.24)
GFR, Estimated: >60 mL/min (ref >60)
Glucose, Bld: 171 mg/dL — ABNORMAL HIGH (ref 70–99)
Potassium: 3.4 mmol/L — ABNORMAL LOW (ref 3.5–5.1)
Sodium: 136 mmol/L (ref 135–145)
Total Bilirubin: 1.1 mg/dL (ref 0.3–1.2)
Total Protein: 7.5 g/dL (ref 6.5–8.1)

## 2022-08-07 ENCOUNTER — Encounter
Admission: RE | Disposition: A | Discharge status: Home / Self Care | Payer: Self-pay | Source: Ambulatory Visit | Attending: Surgery

## 2022-08-07 ENCOUNTER — Ambulatory Visit: Payer: PPO | Admitting: Urgent Care

## 2022-08-07 ENCOUNTER — Other Ambulatory Visit: Payer: Self-pay

## 2022-08-07 ENCOUNTER — Ambulatory Visit
Admission: RE | Admit: 2022-08-07 | Discharge: 2022-08-07 | Disposition: A | Discharge status: Home / Self Care | Payer: PPO | Source: Ambulatory Visit | Attending: Surgery | Admitting: Surgery

## 2022-08-07 ENCOUNTER — Encounter: Payer: Self-pay | Admitting: Surgery

## 2022-08-07 DIAGNOSIS — K4020 Bilateral inguinal hernia, without obstruction or gangrene, not specified as recurrent: Secondary | ICD-10-CM | POA: Insufficient documentation

## 2022-08-07 DIAGNOSIS — Z87891 Personal history of nicotine dependence: Secondary | ICD-10-CM | POA: Insufficient documentation

## 2022-08-07 DIAGNOSIS — I10 Essential (primary) hypertension: Secondary | ICD-10-CM | POA: Diagnosis not present

## 2022-08-07 SURGERY — REPAIR, HERNIA, INGUINAL, BILATERAL, ROBOT-ASSISTED
Anesthesia: General | Laterality: Bilateral

## 2022-08-07 MED ORDER — ROCURONIUM BROMIDE 100 MG/10ML IV SOLN
INTRAVENOUS | Status: DC | PRN
  Administered 2022-08-07: 20 mg via INTRAVENOUS
  Administered 2022-08-07: 50 mg via INTRAVENOUS

## 2022-08-07 MED ORDER — BUPIVACAINE LIPOSOME 1.3 % IJ SUSP
INTRAMUSCULAR | Status: DC | PRN
  Administered 2022-08-07: 20 mL

## 2022-08-07 MED ORDER — LIDOCAINE HCL (PF) 2 % IJ SOLN
INTRAMUSCULAR | Status: AC
  Filled 2022-08-07: qty 5

## 2022-08-07 MED ORDER — CEFAZOLIN SODIUM-DEXTROSE 2-4 GM/100ML-% IV SOLN
2.0000 g | INTRAVENOUS | Status: AC
  Administered 2022-08-07: 2 g via INTRAVENOUS

## 2022-08-07 MED ORDER — CHLORHEXIDINE GLUCONATE 0.12 % MT SOLN
OROMUCOSAL | Status: AC
  Filled 2022-08-07: qty 15

## 2022-08-07 MED ORDER — CELECOXIB 200 MG PO CAPS
ORAL_CAPSULE | ORAL | Status: AC
  Filled 2022-08-07: qty 1

## 2022-08-07 MED ORDER — FENTANYL CITRATE (PF) 100 MCG/2ML IJ SOLN: 25.0000 ug | INTRAMUSCULAR | Status: DC | PRN

## 2022-08-07 MED ORDER — MIDAZOLAM HCL 2 MG/2ML IJ SOLN
INTRAMUSCULAR | Status: AC
  Filled 2022-08-07: qty 2

## 2022-08-07 MED ORDER — FENTANYL CITRATE (PF) 100 MCG/2ML IJ SOLN
INTRAMUSCULAR | Status: DC | PRN
  Administered 2022-08-07 (×2): 50 ug via INTRAVENOUS

## 2022-08-07 MED ORDER — PROPOFOL 10 MG/ML IV BOLUS
INTRAVENOUS | Status: AC
  Filled 2022-08-07: qty 20

## 2022-08-07 MED ORDER — EPINEPHRINE PF 1 MG/ML IJ SOLN
INTRAMUSCULAR | Status: AC
  Filled 2022-08-07: qty 1

## 2022-08-07 MED ORDER — CEFAZOLIN SODIUM-DEXTROSE 2-4 GM/100ML-% IV SOLN
INTRAVENOUS | Status: AC
  Filled 2022-08-07: qty 100

## 2022-08-07 MED ORDER — 0.9 % SODIUM CHLORIDE (POUR BTL) OPTIME
TOPICAL | Status: DC | PRN
  Administered 2022-08-07: 500 mL

## 2022-08-07 MED ORDER — ACETAMINOPHEN 500 MG PO TABS
1000.0000 mg | ORAL_TABLET | ORAL | Status: AC
  Administered 2022-08-07: 1000 mg via ORAL

## 2022-08-07 MED ORDER — EPHEDRINE SULFATE (PRESSORS) 50 MG/ML IJ SOLN
INTRAMUSCULAR | Status: DC | PRN
  Administered 2022-08-07: 10 mg via INTRAVENOUS
  Administered 2022-08-07: 15 mg via INTRAVENOUS

## 2022-08-07 MED ORDER — DEXAMETHASONE SODIUM PHOSPHATE 10 MG/ML IJ SOLN
INTRAMUSCULAR | Status: AC
  Filled 2022-08-07: qty 1

## 2022-08-07 MED ORDER — GABAPENTIN 300 MG PO CAPS
300.0000 mg | ORAL_CAPSULE | ORAL | Status: AC
  Administered 2022-08-07: 300 mg via ORAL

## 2022-08-07 MED ORDER — PROPOFOL 10 MG/ML IV BOLUS
INTRAVENOUS | Status: DC | PRN
  Administered 2022-08-07: 250 mg via INTRAVENOUS

## 2022-08-07 MED ORDER — FENTANYL CITRATE (PF) 100 MCG/2ML IJ SOLN
INTRAMUSCULAR | Status: AC
  Filled 2022-08-07: qty 2

## 2022-08-07 MED ORDER — ONDANSETRON HCL 4 MG/2ML IJ SOLN
INTRAMUSCULAR | Status: AC
  Filled 2022-08-07: qty 2

## 2022-08-07 MED ORDER — BUPIVACAINE HCL (PF) 0.25 % IJ SOLN
INTRAMUSCULAR | Status: AC
  Filled 2022-08-07: qty 30

## 2022-08-07 MED ORDER — SUCCINYLCHOLINE CHLORIDE 200 MG/10ML IV SOSY
PREFILLED_SYRINGE | INTRAVENOUS | Status: DC | PRN
  Administered 2022-08-07: 120 mg via INTRAVENOUS

## 2022-08-07 MED ORDER — LIDOCAINE HCL (CARDIAC) PF 100 MG/5ML IV SOSY
PREFILLED_SYRINGE | INTRAVENOUS | Status: DC | PRN
  Administered 2022-08-07: 60 mg via INTRAVENOUS

## 2022-08-07 MED ORDER — BUPIVACAINE-EPINEPHRINE (PF) 0.25% -1:200000 IJ SOLN
INTRAMUSCULAR | Status: DC | PRN
  Administered 2022-08-07: 30 mL

## 2022-08-07 MED ORDER — SUGAMMADEX SODIUM 200 MG/2ML IV SOLN
INTRAVENOUS | Status: DC | PRN
  Administered 2022-08-07: 200 mg via INTRAVENOUS

## 2022-08-07 MED ORDER — CHLORHEXIDINE GLUCONATE CLOTH 2 % EX PADS
6.0000 | MEDICATED_PAD | Freq: Once | CUTANEOUS | Status: AC
  Administered 2022-08-07: 6 via TOPICAL

## 2022-08-07 MED ORDER — ONDANSETRON HCL 4 MG/2ML IJ SOLN
INTRAMUSCULAR | Status: DC | PRN
  Administered 2022-08-07: 4 mg via INTRAVENOUS

## 2022-08-07 MED ORDER — ACETAMINOPHEN 500 MG PO TABS
ORAL_TABLET | ORAL | Status: AC
  Filled 2022-08-07: qty 2

## 2022-08-07 MED ORDER — CELECOXIB 200 MG PO CAPS
200.0000 mg | ORAL_CAPSULE | ORAL | Status: AC
  Administered 2022-08-07: 200 mg via ORAL

## 2022-08-07 MED ORDER — EPHEDRINE 5 MG/ML INJ
INTRAVENOUS | Status: AC
  Filled 2022-08-07: qty 5

## 2022-08-07 MED ORDER — GABAPENTIN 300 MG PO CAPS
ORAL_CAPSULE | ORAL | Status: AC
  Filled 2022-08-07: qty 1

## 2022-08-07 MED ORDER — DROPERIDOL 2.5 MG/ML IJ SOLN: 0.6250 mg | Freq: Once | INTRAMUSCULAR | Status: DC | PRN

## 2022-08-07 MED ORDER — DEXMEDETOMIDINE HCL IN NACL 80 MCG/20ML IV SOLN
INTRAVENOUS | Status: DC | PRN
  Administered 2022-08-07: 4 ug via INTRAVENOUS
  Administered 2022-08-07 (×2): 8 ug via INTRAVENOUS

## 2022-08-07 MED ORDER — ORAL CARE MOUTH RINSE: 15.0000 mL | Freq: Once | OROMUCOSAL | Status: AC

## 2022-08-07 MED ORDER — CHLORHEXIDINE GLUCONATE 0.12 % MT SOLN
15.0000 mL | Freq: Once | OROMUCOSAL | Status: AC
  Administered 2022-08-07: 15 mL via OROMUCOSAL

## 2022-08-07 MED ORDER — BUPIVACAINE LIPOSOME 1.3 % IJ SUSP: 20.0000 mL | Freq: Once | INTRAMUSCULAR | Status: DC

## 2022-08-07 MED ORDER — HYDROCODONE-ACETAMINOPHEN 5-325 MG PO TABS
1.0000 | ORAL_TABLET | Freq: Four times a day (QID) | ORAL | 0 refills | Status: DC | PRN
Start: 2022-08-07 — End: 2022-08-22

## 2022-08-07 MED ORDER — MIDAZOLAM HCL 2 MG/2ML IJ SOLN
INTRAMUSCULAR | Status: DC | PRN
  Administered 2022-08-07: 1 mg via INTRAVENOUS

## 2022-08-07 MED ORDER — ROCURONIUM BROMIDE 10 MG/ML (PF) SYRINGE
PREFILLED_SYRINGE | INTRAVENOUS | Status: AC
  Filled 2022-08-07: qty 10

## 2022-08-07 MED ORDER — IBUPROFEN 800 MG PO TABS
800.0000 mg | ORAL_TABLET | Freq: Three times a day (TID) | ORAL | 0 refills | Status: AC | PRN
Start: 2022-08-07 — End: ?

## 2022-08-07 MED ORDER — PHENYLEPHRINE 80 MCG/ML (10ML) SYRINGE FOR IV PUSH (FOR BLOOD PRESSURE SUPPORT)
PREFILLED_SYRINGE | INTRAVENOUS | Status: AC
  Filled 2022-08-07: qty 10

## 2022-08-07 MED ORDER — BUPIVACAINE-EPINEPHRINE (PF) 0.25% -1:200000 IJ SOLN: INTRAMUSCULAR | Status: DC | PRN

## 2022-08-07 MED ORDER — PHENYLEPHRINE 80 MCG/ML (10ML) SYRINGE FOR IV PUSH (FOR BLOOD PRESSURE SUPPORT)
PREFILLED_SYRINGE | INTRAVENOUS | Status: DC | PRN
  Administered 2022-08-07: 160 ug via INTRAVENOUS

## 2022-08-07 MED ORDER — LACTATED RINGERS IV SOLN: INTRAVENOUS | Status: DC

## 2022-08-07 MED ORDER — DEXAMETHASONE SODIUM PHOSPHATE 10 MG/ML IJ SOLN
INTRAMUSCULAR | Status: DC | PRN
  Administered 2022-08-07: 8 mg via INTRAVENOUS

## 2022-08-07 SURGICAL SUPPLY — 46 items
ADH SKN CLS APL DERMABOND .7 (GAUZE/BANDAGES/DRESSINGS) ×1
BLADE CLIPPER SURG (BLADE) ×1 IMPLANT
COVER TIP SHEARS 8 DVNC (MISCELLANEOUS) ×1 IMPLANT
COVER WAND RF STERILE (DRAPES) ×1 IMPLANT
DERMABOND ADVANCED .7 DNX12 (GAUZE/BANDAGES/DRESSINGS) ×1 IMPLANT
DRAPE ARM DVNC X/XI (DISPOSABLE) ×3 IMPLANT
DRAPE COLUMN DVNC XI (DISPOSABLE) ×1 IMPLANT
ELECT REM PT RETURN 9FT ADLT (ELECTROSURGICAL) ×1
ELECTRODE REM PT RTRN 9FT ADLT (ELECTROSURGICAL) ×1 IMPLANT
FORCEPS BPLR R/ABLATION 8 DVNC (INSTRUMENTS) ×1 IMPLANT
GLOVE ORTHO TXT STRL SZ7.5 (GLOVE) ×3 IMPLANT
GOWN STRL REUS W/ TWL LRG LVL3 (GOWN DISPOSABLE) ×1 IMPLANT
GOWN STRL REUS W/ TWL XL LVL3 (GOWN DISPOSABLE) ×2 IMPLANT
GOWN STRL REUS W/TWL LRG LVL3 (GOWN DISPOSABLE) ×1
GOWN STRL REUS W/TWL XL LVL3 (GOWN DISPOSABLE) ×2
GRASPER SUT TROCAR 14GX15 (MISCELLANEOUS) IMPLANT
IRRIGATION STRYKERFLOW (MISCELLANEOUS) IMPLANT
IRRIGATOR STRYKERFLOW (MISCELLANEOUS)
IV NS 1000ML (IV SOLUTION)
IV NS 1000ML BAXH (IV SOLUTION) IMPLANT
KIT PINK PAD W/HEAD ARE REST (MISCELLANEOUS) ×1
KIT PINK PAD W/HEAD ARM REST (MISCELLANEOUS) ×1 IMPLANT
LABEL OR SOLS (LABEL) ×1 IMPLANT
MANIFOLD NEPTUNE II (INSTRUMENTS) ×1 IMPLANT
MESH 3DMAX LIGHT 4.8X6.7 LT XL (Mesh General) IMPLANT
MESH 3DMAX LIGHT 4.8X6.7 RT XL (Mesh General) IMPLANT
NDL DRIVE SUT CUT DVNC (INSTRUMENTS) ×1 IMPLANT
NDL HYPO 22X1.5 SAFETY MO (MISCELLANEOUS) ×1 IMPLANT
NDL INSUFFLATION 14GA 120MM (NEEDLE) IMPLANT
NEEDLE DRIVE SUT CUT DVNC (INSTRUMENTS) ×1 IMPLANT
NEEDLE HYPO 22X1.5 SAFETY MO (MISCELLANEOUS) ×1 IMPLANT
NEEDLE INSUFFLATION 14GA 120MM (NEEDLE) ×1 IMPLANT
PACK LAP CHOLECYSTECTOMY (MISCELLANEOUS) ×1 IMPLANT
SCISSORS MNPLR CVD DVNC XI (INSTRUMENTS) ×1 IMPLANT
SEAL UNIV 5-12 XI (MISCELLANEOUS) ×3 IMPLANT
SET TUBE SMOKE EVAC HIGH FLOW (TUBING) ×1 IMPLANT
SOL ELECTROSURG ANTI STICK (MISCELLANEOUS) ×1
SOLUTION ELECTROSURG ANTI STCK (MISCELLANEOUS) ×1 IMPLANT
SUT MNCRL 4-0 (SUTURE) ×2
SUT MNCRL 4-0 27XMFL (SUTURE) ×2
SUT VIC AB 2-0 SH 27 (SUTURE) ×1
SUT VIC AB 2-0 SH 27XBRD (SUTURE) ×1 IMPLANT
SUT VICRYL 3-0 (SUTURE) IMPLANT
SUT VLOC 90 S/L VL9 GS22 (SUTURE) IMPLANT
SUTURE MNCRL 4-0 27XMF (SUTURE) ×1 IMPLANT
WATER STERILE IRR 500ML POUR (IV SOLUTION) ×1 IMPLANT

## 2022-08-07 NOTE — Progress Notes (Signed)
Explained to pt if unable to void and have urgency, present to ED.  Pt and partner stated understandign.

## 2022-08-07 NOTE — Discharge Instructions (Addendum)

## 2022-08-07 NOTE — Transfer of Care (Signed)
Immediate Anesthesia Transfer of Care Note  Patient: Curtis Walker  Procedure(s) Performed: XI ROBOTIC ASSISTED BILATERAL INGUINAL HERNIA (Bilateral)  Patient Location: PACU  Anesthesia Type:General  Level of Consciousness: awake, alert , and oriented  Airway & Oxygen Therapy: Patient Spontanous Breathing and Patient connected to face mask oxygen  Post-op Assessment: Report given to RN, Post -op Vital signs reviewed and stable, and Patient moving all extremities  Post vital signs: Reviewed and stable  Last Vitals:  Vitals Value Taken Time  BP 121/62 08/07/22 1142  Temp 36.1 C 08/07/22 1142  Pulse 71 08/07/22 1143  Resp 17 08/07/22 1143  SpO2 99 % 08/07/22 1143  Vitals shown include unvalidated device data.  Last Pain:  Vitals:   08/07/22 0909  TempSrc: Temporal  PainSc: 0-No pain      Patients Stated Pain Goal: 0 (08/07/22 0909)  Complications: No notable events documented.

## 2022-08-07 NOTE — Anesthesia Procedure Notes (Signed)
Procedure Name: Intubation Date/Time: 08/07/2022 9:49 AM  Performed by: Jeannene Patella, CRNAPre-anesthesia Checklist: Patient identified, Emergency Drugs available, Suction available, Patient being monitored and Timeout performed Patient Re-evaluated:Patient Re-evaluated prior to induction Oxygen Delivery Method: Circle system utilized Preoxygenation: Pre-oxygenation with 100% oxygen Induction Type: IV induction Ventilation: Mask ventilation with difficulty and Oral airway inserted - appropriate to patient size Grade View: Grade II Tube type: Oral Tube size: 8.0 mm Number of attempts: 1 Airway Equipment and Method: Stylet, LTA kit utilized and Video-laryngoscopy Placement Confirmation: ETT inserted through vocal cords under direct vision, positive ETCO2 and breath sounds checked- equal and bilateral Secured at: 24 (at lip) cm Tube secured with: Tape Dental Injury: Teeth and Oropharynx as per pre-operative assessment  Comments: Front teeth 8,9 with chips edges preop and patient is aware, short chin 2 fb or less grade 2 view with cricoid pressure

## 2022-08-07 NOTE — Anesthesia Preprocedure Evaluation (Signed)
Anesthesia Evaluation  Patient identified by MRN, date of birth, ID band Patient awake    Reviewed: Allergy & Precautions, NPO status , Patient's Chart, lab work & pertinent test results  History of Anesthesia Complications Negative for: history of anesthetic complications  Airway Mallampati: II  TM Distance: >3 FB Neck ROM: Full    Dental  (+) Dental Advidsory Given, Caps, Teeth Intact   Pulmonary asthma (as a child) , neg sleep apnea, neg COPD, former smoker   breath sounds clear to auscultation- rhonchi (-) wheezing      Cardiovascular Exercise Tolerance: Good hypertension, (-) CAD, (-) Past MI, (-) Cardiac Stents and (-) CABG  Rhythm:Regular Rate:Normal - Systolic murmurs and - Diastolic murmurs    Neuro/Psych neg Seizures negative neurological ROS  negative psych ROS   GI/Hepatic negative GI ROS, Neg liver ROS,,,  Endo/Other  negative endocrine ROSneg diabetes    Renal/GU negative Renal ROS     Musculoskeletal negative musculoskeletal ROS (+)    Abdominal  (+) - obese  Peds  Hematology negative hematology ROS (+)   Anesthesia Other Findings Past Medical History: No date: Allergy     Comment:  RHINITIS No date: Dyslipidemia No date: History of colonic polyps   Reproductive/Obstetrics                             Anesthesia Physical Anesthesia Plan  ASA: 2  Anesthesia Plan: General   Post-op Pain Management:    Induction: Intravenous  PONV Risk Score and Plan: 1 and Ondansetron, Dexamethasone and Treatment may vary due to age or medical condition  Airway Management Planned: Oral ETT  Additional Equipment:   Intra-op Plan:   Post-operative Plan: Extubation in OR  Informed Consent: I have reviewed the patients History and Physical, chart, labs and discussed the procedure including the risks, benefits and alternatives for the proposed anesthesia with the patient or  authorized representative who has indicated his/her understanding and acceptance.     Dental advisory given  Plan Discussed with: CRNA and Anesthesiologist  Anesthesia Plan Comments:         Anesthesia Quick Evaluation

## 2022-08-07 NOTE — Interval H&P Note (Signed)
History and Physical Interval Note:  08/07/2022 9:19 AM  Curtis Walker  has presented today for surgery, with the diagnosis of bilateral inguinal hernia.  The various methods of treatment have been discussed with the patient and family. After consideration of risks, benefits and other options for treatment, the patient has consented to  Procedure(s): XI ROBOTIC ASSISTED BILATERAL INGUINAL HERNIA (Bilateral) as a surgical intervention.  The patient's history has been reviewed, patient examined, no change in status, stable for surgery.  I have reviewed the patient's chart and labs.  Questions were answered to the patient's satisfaction.     Campbell Lerner

## 2022-08-07 NOTE — Op Note (Signed)
Robotic assisted Laparoscopic Transabdominal Bilateral Inguinal Hernia Repair with Mesh       Pre-operative Diagnosis:   Bilateral Inguinal Hernia  Post-operative Diagnosis: Same   Procedure: Robotic assisted Laparoscopic  repair of bilateral inguinal hernia(s)   Surgeon: Campbell Lerner, M.D., FACS   Anesthesia: GETA   Findings: bilateral direct inguinal hernias.         Procedure Details  The patient was seen again in the Holding Room. The benefits, complications, treatment options, and expected outcomes were discussed with the patient. The risks of bleeding, infection, recurrence of symptoms, failure to resolve symptoms, recurrence of hernia, ischemic orchitis, chronic pain syndrome or neuroma, were reviewed again. The likelihood of improving the patient's symptoms with return to their baseline status is good.  The patient and/or family concurred with the proposed plan, giving informed consent.  The patient was taken to Operating Room, identified  and the procedure verified as Laparoscopic Inguinal Hernia Repair. Laterality confirmed.  A Time Out was held and the above information confirmed.   Prior to the induction of general anesthesia, antibiotic prophylaxis was administered. VTE prophylaxis was in place. General endotracheal anesthesia was then administered and tolerated well. After the induction, the abdomen was prepped with Chloraprep and draped in the sterile fashion. The patient was positioned in the supine position.   After local infiltration of quarter percent Marcaine with epinephrine, stab incision was made left upper quadrant.  On the left at Palmer's point, the Veress needle is passed with sensation of the layers to penetrate the abdominal wall and into the peritoneum.  Saline drop test is confirmed peritoneal placement.  Insufflation is initiated with carbon dioxide to pressures of 15 mmHg. An 8.5 mm port is placed to the left off of the midline, with blunt tipped trocar.   Pneumoperitoneum maintained w/o HD changes to pressures of 15 mm Hg with CO2. No evidence of bowel injuries.  Two 8.5 mm ports placed under direct vision in each upper quadrant. The laparoscopy revealed bilateral direct defect(s).   Under direct visualization I placed the Veress needle into the preperitoneal space. The robot was brought ot the table and docked in the standard fashion, no collision between arms was observed. Instruments were kept under direct view at all times. For bilateral inguinal hernia repair,  I developed a peritoneal flap. The sac(s) were reduced and dissected free from adjacent structures. We preserved the vas and the vessels, and visualized them to their convergence and beyond in the retroperitoneum. Once dissection was completed an extra large left sided BARD 3D Light mesh was placed and secured at three points with interrupted 2-0 Vicryl to the pubic tubercle and anteriorly. There was good coverage of the direct, indirect and femoral spaces.  Second look revealed no complications or injuries. Attention then was turned to the opposite side. The sac was also reduced and dissected free from adjacent structures. We preserved the vas and the vessels, in like manner with adequate posterior dissection. Once dissection was completed the same, but contralateral mesh was placed and secured in like manner with interrupted 2-0 Vicryl. There was good coverage of the direct, indirect and femoral spaces.  The flap was then closed with 3-0 V-lock suture.  Peritoneal closure without defects.  Once assuring that hemostasis was adequate, all needles/sponges removed, and the robot was undocked.  Under direct visualization the previously placed the Veress needle in the preperitoneal space, the Veress' valve was released allowing extraperitoneal CO2 to escape, it was also used to access the  space for supplemental local anesthesia of Exparel. The ports were removed, the abdomen desulflated.  4-0  subcuticular Monocryl was used at all skin edges. Dermabond was placed.  Patient tolerated the procedure well. There were no complications. He was taken to the recovery room in stable condition.           Campbell Lerner, M.D., FACS 08/07/2022, 11:49 AM

## 2022-08-16 NOTE — Anesthesia Postprocedure Evaluation (Signed)
Anesthesia Post Note  Patient: Curtis Walker  Procedure(s) Performed: XI ROBOTIC ASSISTED BILATERAL INGUINAL HERNIA (Bilateral)  Patient location during evaluation: PACU Anesthesia Type: General Level of consciousness: awake and alert Pain management: pain level controlled Vital Signs Assessment: post-procedure vital signs reviewed and stable Respiratory status: spontaneous breathing, nonlabored ventilation, respiratory function stable and patient connected to nasal cannula oxygen Cardiovascular status: blood pressure returned to baseline and stable Postop Assessment: no apparent nausea or vomiting Anesthetic complications: no   No notable events documented.   Last Vitals:  Vitals:   08/07/22 1215 08/07/22 1234  BP: 127/76 130/76  Pulse: 75 70  Resp: 17 15  Temp: (!) 36.1 C (!) 36.2 C  SpO2: 96% 97%    Last Pain:  Vitals:   08/07/22 1234  TempSrc: Temporal  PainSc: 0-No pain                 Lenard Simmer

## 2022-08-22 ENCOUNTER — Encounter: Payer: Self-pay | Admitting: Physician Assistant

## 2022-08-22 ENCOUNTER — Ambulatory Visit (INDEPENDENT_AMBULATORY_CARE_PROVIDER_SITE_OTHER): Payer: PPO | Admitting: Physician Assistant

## 2022-08-22 VITALS — BP 149/72 | HR 82 | Temp 98.5°F | Ht 75.0 in | Wt 218.4 lb

## 2022-08-22 DIAGNOSIS — K4020 Bilateral inguinal hernia, without obstruction or gangrene, not specified as recurrent: Secondary | ICD-10-CM

## 2022-08-22 DIAGNOSIS — Z09 Encounter for follow-up examination after completed treatment for conditions other than malignant neoplasm: Secondary | ICD-10-CM

## 2022-08-22 NOTE — Patient Instructions (Signed)

## 2022-08-22 NOTE — Progress Notes (Signed)
[facility] POST-OP OFFICE VISIT  08/22/2022  HPI: Curtis Walker is a 67 y.o. male 15 days s/p robotic assisted laparoscopic bilateral inguinal hernia repair with Dr Claudine Mouton   He has done very well Soreness for about 3-4 days; had to sleep in chair but has "gotten better every day" since No fever, chills, nausea, emesis Constipation for first 2 days; now back to normal No issues with incisions Ambulating well No other complaints   Vital signs: BP (!) 149/72   Pulse 82   Temp 98.5 F (36.9 C) (Oral)   Ht 6\' 3"  (1.905 m)   Wt 218 lb 6.4 oz (99.1 kg)   SpO2 95%   BMI 27.30 kg/m    Physical Exam: Constitutional: Well appearing male, NAD Abdomen: Soft, non-tender, non-distended, no rebound/guarding Skin: Laparoscopic incisions are healing well, no erythema or drainage   Assessment/Plan: This is a 67 y.o. male 15 days s/p robotic assisted laparoscopic bilateral inguinal hernia repair with Dr Claudine Mouton    - Pain control prn  - Reviewed wound care recommendation  - Reviewed lifting restrictions; 6 weeks total  - He can follow up on as needed basis; He understands to call with questions/concerns  -- Lynden Oxford, PA-C [facility] 08/22/2022, 3:32 PM M-F: 7am - 4pm

## 2023-02-23 ENCOUNTER — Other Ambulatory Visit: Payer: Self-pay | Admitting: Family Medicine

## 2023-02-23 DIAGNOSIS — E785 Hyperlipidemia, unspecified: Secondary | ICD-10-CM

## 2023-02-25 ENCOUNTER — Ambulatory Visit (INDEPENDENT_AMBULATORY_CARE_PROVIDER_SITE_OTHER): Payer: PPO | Admitting: Family Medicine

## 2023-02-25 ENCOUNTER — Encounter: Payer: Self-pay | Admitting: Family Medicine

## 2023-02-25 VITALS — BP 130/80 | HR 73 | Ht 75.0 in | Wt 222.0 lb

## 2023-02-25 DIAGNOSIS — N1831 Chronic kidney disease, stage 3a: Secondary | ICD-10-CM

## 2023-02-25 DIAGNOSIS — E7439 Other disorders of intestinal carbohydrate absorption: Secondary | ICD-10-CM

## 2023-02-25 DIAGNOSIS — R7303 Prediabetes: Secondary | ICD-10-CM

## 2023-02-25 DIAGNOSIS — Z0000 Encounter for general adult medical examination without abnormal findings: Secondary | ICD-10-CM

## 2023-02-25 DIAGNOSIS — N486 Induration penis plastica: Secondary | ICD-10-CM | POA: Diagnosis not present

## 2023-02-25 DIAGNOSIS — N529 Male erectile dysfunction, unspecified: Secondary | ICD-10-CM

## 2023-02-25 DIAGNOSIS — Z860100 Personal history of colon polyps, unspecified: Secondary | ICD-10-CM | POA: Diagnosis not present

## 2023-02-25 DIAGNOSIS — J301 Allergic rhinitis due to pollen: Secondary | ICD-10-CM | POA: Diagnosis not present

## 2023-02-25 DIAGNOSIS — I10 Essential (primary) hypertension: Secondary | ICD-10-CM

## 2023-02-25 DIAGNOSIS — E785 Hyperlipidemia, unspecified: Secondary | ICD-10-CM

## 2023-02-25 DIAGNOSIS — N5201 Erectile dysfunction due to arterial insufficiency: Secondary | ICD-10-CM

## 2023-02-25 LAB — POCT GLYCOSYLATED HEMOGLOBIN (HGB A1C): Hemoglobin A1C: 6 % — AB (ref 4.0–5.6)

## 2023-02-25 MED ORDER — ROSUVASTATIN CALCIUM 20 MG PO TABS: 20.0000 mg | ORAL_TABLET | Freq: Every day | ORAL | 3 refills | Status: DC

## 2023-02-25 MED ORDER — LOSARTAN POTASSIUM 50 MG PO TABS: 50.0000 mg | ORAL_TABLET | Freq: Every day | ORAL | 3 refills | Status: DC

## 2023-02-25 NOTE — Patient Instructions (Signed)
  Mr. Curtis Walker , Thank you for taking time to come for your Medicare Wellness Visit. I appreciate your ongoing commitment to your health goals. Please review the following plan we discussed and let me know if I can assist you in the future.   These are the goals we discussed:  Goals      Patient Stated     02/19/2022, no goals        This is a list of the screening recommended for you and due dates:  Health Maintenance  Topic Date Due   Colon Cancer Screening  11/24/2023   Medicare Annual Wellness Visit  02/25/2024   DTaP/Tdap/Td vaccine (4 - Td or Tdap) 03/02/2031   Pneumonia Vaccine  Completed   Flu Shot  Completed   COVID-19 Vaccine  Completed   Hepatitis C Screening  Completed   Zoster (Shingles) Vaccine  Completed   HPV Vaccine  Aged Out

## 2023-02-25 NOTE — Progress Notes (Signed)
 Curtis Walker is a 68 y.o. male who presents for annual wellness visit,CPE and follow-up on chronic medical conditions.  He is now retired and enjoying his retirement. His allergies seem to be under control.  He does use the ED medication on an as-needed basis.  Continues on losartan , Crestor  and sildenafil  and not having difficulty with them.  Does have a history of colonic polyps and is scheduled for routine follow-up on that.  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  Does have laboratory evidence of early CKD.   Immunizations and Health Maintenance Immunization History  Administered Date(s) Administered   DTaP 07/28/2001   Fluad Quad(high Dose 65+) 12/14/2018, 12/15/2019, 11/09/2021   Influenza Split 01/08/2011   Influenza Whole 01/01/2010   Influenza, High Dose Seasonal PF 11/24/2020, 10/10/2022   Influenza, Seasonal, Injecte, Preservative Fre 01/17/2012   Influenza,inj,Quad PF,6+ Mos 01/18/2013, 12/28/2013, 01/03/2015, 01/16/2016, 01/16/2017, 01/19/2018   Moderna SARS-COV2 Booster Vaccination 05/19/2020   Moderna Sars-Covid-2 Vaccination 03/11/2019, 04/08/2019, 12/10/2019   Pfizer Covid-19 Vaccine Bivalent Booster 64yrs & up 11/24/2020   Pfizer(Comirnaty)Fall Seasonal Vaccine 12 years and older 10/10/2022   Pneumococcal Conjugate-13 01/13/2020   Pneumococcal Polysaccharide-23 12/14/2018   RSV,unspecified 11/18/2021   Tdap 01/08/2011, 03/01/2021   Unspecified SARS-COV-2 Vaccination 11/09/2021   Zoster Recombinant(Shingrix ) 01/16/2017, 03/20/2017   Zoster, Live 12/28/2013   There are no preventive care reminders to display for this patient.   Last colonoscopy:11/2018-Dr Wohl Last PSA:01/16/2016 Dentist:02/2023 Ophtho:2024 Exercise:walks 2-3 miles daily  Other doctors caring for patient include: GI-Dr. Jinny Dentist: [facility]: [facility] F[facility] Surgeon:Rodenberg (hernia)   Advanced  Directives:YES Does Patient Have a Medical Advance Directive?: Yes Type of Advance Directive: Healthcare Power of Attorney, Living will, Out of facility DNR (pink MOST or yellow form) Copy of Healthcare Power of Attorney in Chart?: Yes - validated most recent copy scanned in chart (See row information)  Depression screen:  See questionnaire below.        02/25/2023    1:42 PM 02/19/2022    4:04 PM 02/15/2021   10:41 AM 12/15/2019    1:57 PM 12/14/2018    2:06 PM  Depression screen PHQ 2/9  Decreased Interest 0 0 0 0 0  Down, Depressed, Hopeless 0 0 0 0 0  PHQ - 2 Score 0 0 0 0 0  Altered sleeping  0     Tired, decreased energy  0     Change in appetite  0     Feeling bad or failure about yourself   0     Trouble concentrating  0     Moving slowly or fidgety/restless  0     Suicidal thoughts  0     PHQ-9 Score  0     Difficult doing work/chores  Not difficult at all       Fall Screen: See Questionaire below.      02/25/2023    1:41 PM 07/25/2022    8:57 AM 03/18/2022   10:08 AM 02/19/2022    4:04 PM 02/15/2021   10:41 AM  Fall Risk   Falls in the past year? 0 0 0 0 0  Number falls in past yr: 0 0 0 0 0  Injury with Fall? 0 0 0 0 0  Risk for fall due to : No Fall Risks  No Fall Risks Medication side effect No Fall Risks  Follow up Falls evaluation completed  Falls evaluation completed Falls prevention discussed;Education provided;Falls evaluation completed Falls evaluation completed    ADL screen:  See questionnaire below.  Functional Status Survey: Is the patient deaf or have difficulty hearing?: No Does the patient have difficulty seeing, even when wearing glasses/contacts?: No Does the patient have difficulty concentrating, remembering, or making decisions?: No Does the patient have difficulty walking or climbing stairs?: No Does the patient have difficulty dressing or bathing?: No Does the patient have difficulty doing errands alone such as visiting a doctor's office or  shopping?: No   Review of Systems  Constitutional: -, -unexpected weight change, -anorexia, -fatigue Allergy: -sneezing, -itching, -congestion Dermatology: denies changing moles, rash, lumps ENT: -runny nose, -ear pain, -sore throat,  Cardiology:  -chest pain, -palpitations, -orthopnea, Respiratory: -cough, -shortness of breath, -dyspnea on exertion, -wheezing,  Gastroenterology: -abdominal pain, -nausea, -vomiting, -diarrhea, -constipation, -dysphagia Hematology: -bleeding or bruising problems Musculoskeletal: -arthralgias, -myalgias, -joint swelling, -back pain, - Ophthalmology: -vision changes,  Urology: -dysuria, -difficulty urinating,  -urinary frequency, -urgency, incontinence Neurology: -, -numbness, , -memory loss, -falls, -dizziness    PHYSICAL EXAM:  BP 130/80   Pulse 73   Ht 6' 3 (1.905 m)   Wt 222 lb (100.7 kg)   BMI 27.75 kg/m   General Appearance: Alert, cooperative, no distress, appears stated age Head: Normocephalic, without obvious abnormality, atraumatic Eyes: PERRL, conjunctiva/corneas clear, EOM's intact,  Ears: Normal TM's and external ear canals Nose: Nares normal, mucosa normal, no drainage or sinus   tenderness Throat: Lips, mucosa, and tongue normal; teeth and gums normal Neck: Supple, no lymphadenopathy, thyroid:no enlargement/tenderness/nodules; no carotid bruit or JVD Lungs: Clear to auscultation bilaterally without wheezes, rales or ronchi; respirations unlabored Heart: Regular rate and rhythm, S1 and S2 normal, no murmur, rub or gallop Abdomen: Soft, non-tender, nondistended, normoactive bowel sounds, no masses, no hepatosplenomegaly Extremities: No clubbing, cyanosis or edema Pulses: 2+ and symmetric all extremities Skin: Skin color, texture, turgor normal, no rashes or lesions Lymph nodes: Cervical, supraclavicular, and axillary nodes normal Neurologic: CNII-XII intact, normal strength, sensation and gait; reflexes 2+ and symmetric  throughout   Psych: Normal mood, affect, hygiene and grooming  ASSESSMENT/PLAN: Routine general medical examination at a health care facility - Plan: CBC with Differential/Platelet, Comprehensive metabolic panel  Non-seasonal allergic rhinitis due to pollen  History of colonic polyps  Essential hypertension - Plan: losartan  (COZAAR ) 50 MG tablet, CBC with Differential/Platelet, Comprehensive metabolic panel  Hyperlipidemia LDL goal <100 - Plan: Lipid panel, rosuvastatin  (CRESTOR ) 20 MG tablet  Peyronie's disease  Stage 3a chronic kidney disease (HCC) - Plan: Comprehensive metabolic panel  Erectile dysfunction, unspecified erectile dysfunction type  Pre-diabetes - Plan: POCT glycosylated hemoglobin (Hb A1C), Comprehensive metabolic panel    Discussed PSA screening (risks/benefits), recommended at least 30 minutes of aerobic activity at least 5 days/week; proper sunscreen use reviewed; healthy diet and alcohol recommendations (less than or equal to 2 drinks/day) reviewed; regular seatbelt use; changing batteries in smoke detectors. Immunization recommendations discussed.  Colonoscopy recommendations reviewed.   Medicare Attestation I have personally reviewed: The patient's medical and social history Their use of alcohol, tobacco or illicit drugs Their current medications and supplements The patient's functional ability including ADLs,fall risks, home safety risks, cognitive, and hearing and visual impairment Diet and physical activities Evidence for depression or mood disorders  The patient's weight, height, and BMI have been recorded in the chart.  I have made referrals, counseling, and provided education to the patient based on review of the above and I have  provided the patient with a written personalized care plan for preventive services.     Norleen Jobs, MD   02/25/2023

## 2023-02-26 LAB — CBC WITH DIFFERENTIAL/PLATELET
Basophils Absolute: 0.1 10*3/uL (ref 0.0–0.2)
Basos: 1 %
EOS (ABSOLUTE): 0.1 10*3/uL (ref 0.0–0.4)
Eos: 1 %
Hematocrit: 44.9 % (ref 37.5–51.0)
Hemoglobin: 14.9 g/dL (ref 13.0–17.7)
Immature Grans (Abs): 0 10*3/uL (ref 0.0–0.1)
Immature Granulocytes: 0 %
Lymphocytes Absolute: 2.4 10*3/uL (ref 0.7–3.1)
Lymphs: 26 %
MCH: 29.7 pg (ref 26.6–33.0)
MCHC: 33.2 g/dL (ref 31.5–35.7)
MCV: 90 fL (ref 79–97)
Monocytes Absolute: 0.5 10*3/uL (ref 0.1–0.9)
Monocytes: 5 %
Neutrophils Absolute: 6.3 10*3/uL (ref 1.4–7.0)
Neutrophils: 67 %
Platelets: 294 10*3/uL (ref 150–450)
RBC: 5.01 x10E6/uL (ref 4.14–5.80)
RDW: 12.5 % (ref 11.6–15.4)
WBC: 9.4 10*3/uL (ref 3.4–10.8)

## 2023-02-26 LAB — LIPID PANEL
Chol/HDL Ratio: 4 {ratio} (ref 0.0–5.0)
Cholesterol, Total: 158 mg/dL (ref 100–199)
HDL: 40 mg/dL (ref >39)
LDL Chol Calc (NIH): 88 mg/dL (ref 0–99)
Triglycerides: 173 mg/dL — ABNORMAL HIGH (ref 0–149)
VLDL Cholesterol Cal: 30 mg/dL (ref 5–40)

## 2023-02-26 LAB — COMPREHENSIVE METABOLIC PANEL
ALT: 49 [IU]/L — ABNORMAL HIGH (ref 0–44)
AST: 41 [IU]/L — ABNORMAL HIGH (ref 0–40)
Albumin: 4.8 g/dL (ref 3.9–4.9)
Alkaline Phosphatase: 104 [IU]/L (ref 44–121)
BUN/Creatinine Ratio: 15 (ref 10–24)
BUN: 19 mg/dL (ref 8–27)
Bilirubin Total: 0.6 mg/dL (ref 0.0–1.2)
CO2: 21 mmol/L (ref 20–29)
Calcium: 9.8 mg/dL (ref 8.6–10.2)
Chloride: 102 mmol/L (ref 96–106)
Creatinine, Ser: 1.24 mg/dL (ref 0.76–1.27)
Globulin, Total: 2.6 g/dL (ref 1.5–4.5)
Glucose: 97 mg/dL (ref 70–99)
Potassium: 4.3 mmol/L (ref 3.5–5.2)
Sodium: 142 mmol/L (ref 134–144)
Total Protein: 7.4 g/dL (ref 6.0–8.5)
eGFR: 63 mL/min/{1.73_m2} (ref >59)

## 2023-04-07 DIAGNOSIS — L119 Acantholytic disorder, unspecified: Secondary | ICD-10-CM | POA: Diagnosis not present

## 2023-04-07 DIAGNOSIS — L821 Other seborrheic keratosis: Secondary | ICD-10-CM | POA: Diagnosis not present

## 2023-04-07 DIAGNOSIS — D2271 Melanocytic nevi of right lower limb, including hip: Secondary | ICD-10-CM | POA: Diagnosis not present

## 2023-04-07 DIAGNOSIS — D2262 Melanocytic nevi of left upper limb, including shoulder: Secondary | ICD-10-CM | POA: Diagnosis not present

## 2023-04-07 DIAGNOSIS — D225 Melanocytic nevi of trunk: Secondary | ICD-10-CM | POA: Diagnosis not present

## 2023-04-07 DIAGNOSIS — D2261 Melanocytic nevi of right upper limb, including shoulder: Secondary | ICD-10-CM | POA: Diagnosis not present

## 2023-04-07 DIAGNOSIS — D2272 Melanocytic nevi of left lower limb, including hip: Secondary | ICD-10-CM | POA: Diagnosis not present

## 2023-04-07 DIAGNOSIS — D485 Neoplasm of uncertain behavior of skin: Secondary | ICD-10-CM | POA: Diagnosis not present

## 2023-04-07 DIAGNOSIS — L570 Actinic keratosis: Secondary | ICD-10-CM | POA: Diagnosis not present

## 2023-04-07 DIAGNOSIS — L089 Local infection of the skin and subcutaneous tissue, unspecified: Secondary | ICD-10-CM | POA: Diagnosis not present

## 2023-04-08 ENCOUNTER — Encounter: Payer: Self-pay | Admitting: Internal Medicine

## 2023-06-17 ENCOUNTER — Telehealth: Payer: Self-pay | Admitting: Gastroenterology

## 2023-06-17 NOTE — Telephone Encounter (Signed)
 Recall reset  I spoke to pt and explained his recall and the letter notifying him of Dr Curtis Dow new position as a hospitalist

## 2023-06-17 NOTE — Telephone Encounter (Signed)
 The patient called expressing confusion regarding messages he received. He mentioned that he was informed Dr. Ole Berkeley left the practice, but then received another message stating that Dr. Ole Berkeley is still performing procedures. The patient is scheduled for a colonoscopy in October and is unsure whom to contact. He asked if he was calling the correct number, and I confirmed that he was. The patient requested to speak with Prentice Brochure due to his confusion, so I transferred the call to her.

## 2023-07-03 DIAGNOSIS — H2513 Age-related nuclear cataract, bilateral: Secondary | ICD-10-CM | POA: Diagnosis not present

## 2023-07-03 DIAGNOSIS — H43811 Vitreous degeneration, right eye: Secondary | ICD-10-CM | POA: Diagnosis not present

## 2023-07-17 ENCOUNTER — Encounter: Payer: Self-pay | Admitting: Family Medicine

## 2023-07-24 ENCOUNTER — Ambulatory Visit: Admitting: Family Medicine

## 2023-07-24 ENCOUNTER — Encounter: Payer: Self-pay | Admitting: Family Medicine

## 2023-07-24 ENCOUNTER — Other Ambulatory Visit: Payer: Self-pay

## 2023-07-24 VITALS — BP 140/90 | HR 64 | Wt 218.2 lb

## 2023-07-24 DIAGNOSIS — I10 Essential (primary) hypertension: Secondary | ICD-10-CM

## 2023-07-24 DIAGNOSIS — Z0000 Encounter for general adult medical examination without abnormal findings: Secondary | ICD-10-CM

## 2023-07-24 NOTE — Telephone Encounter (Signed)
 Done

## 2023-07-24 NOTE — Progress Notes (Signed)
   Subjective:    Patient ID: Curtis Walker, male    DOB: 02-24-1955, 68 y.o.   MRN: [id number]  HPI He is here for consult concerning his blood pressure.  He has been checking his numbers at home with some elevated and he is here for further consultation.   Review of Systems     Objective:    Physical Exam  His blood pressure cuff was compared to ours and there is a great discrepancy in that.      Assessment & Plan:  Essential hypertension Recommend he get a new blood pressure cuff, get several blood pressure readings on it and then come in here to compare with ours.  Also discussed proper blood pressures checking technique.

## 2023-08-12 ENCOUNTER — Other Ambulatory Visit

## 2023-08-12 DIAGNOSIS — Z0000 Encounter for general adult medical examination without abnormal findings: Secondary | ICD-10-CM | POA: Diagnosis not present

## 2023-08-13 ENCOUNTER — Ambulatory Visit: Payer: Self-pay | Admitting: Family Medicine

## 2023-08-13 LAB — PSA: Prostate Specific Ag, Serum: 0.5 ng/mL (ref 0.0–4.0)

## 2023-10-08 ENCOUNTER — Encounter: Payer: Self-pay | Admitting: Family Medicine

## 2023-10-08 ENCOUNTER — Telehealth: Payer: Self-pay

## 2023-10-08 ENCOUNTER — Ambulatory Visit (INDEPENDENT_AMBULATORY_CARE_PROVIDER_SITE_OTHER): Admitting: Family Medicine

## 2023-10-08 VITALS — BP 140/80 | HR 72 | Wt 206.4 lb

## 2023-10-08 DIAGNOSIS — I10 Essential (primary) hypertension: Secondary | ICD-10-CM | POA: Diagnosis not present

## 2023-10-08 MED ORDER — LOSARTAN POTASSIUM 100 MG PO TABS
100.0000 mg | ORAL_TABLET | Freq: Every day | ORAL | 0 refills | Status: DC
Start: 2023-10-08 — End: 2023-10-29

## 2023-10-08 NOTE — Telephone Encounter (Signed)
 Copied from CRM [id number]. Topic: Clinical - Medical Advice >> Oct 08, 2023  9:52 AM Carlatta H wrote: Reason for CRM: Patient blood pressure had been trending upward for about 1 week//180/91 has been the highest//Please call   Called Pt and scheduled appt. W/ Dr. Vita today @ 2pm

## 2023-10-08 NOTE — Progress Notes (Signed)
   Name: Curtis Walker   Date of Visit: 10/08/23   Date of last visit with me: Visit date not found   CHIEF COMPLAINT:  Chief Complaint  Patient presents with   Acute Visit    Bp issues. This morning it was 180/91.        HPI:  Discussed the use of AI scribe software for clinical note transcription with the patient, who gave verbal consent to proceed.  History of Present Illness   Curtis Walker is a 68 year old male with hypertension who presents with elevated blood pressure readings.  He has experienced elevated blood pressure readings over the past week, with a notable reading of 180 mmHg this morning. Despite a general downward trend in his home-monitored readings, they remain higher than desired. He attributes the recent increase to changes in his home routine, as his housemate has been staying with his mother who has dementia, disrupting his usual schedule.  He has been on a low dose of losartan  (Cozaar ) and has experienced good control in the past. Recently, he started fasting on Wednesdays and has lost ten pounds, which he believes has positively impacted his blood pressure. Despite these efforts, he has noticed a correlation between his disrupted routine and increased blood pressure readings.  He experienced dizziness at the onset of the week when his blood pressure started to rise, but this symptom resolved as the week progressed. He also reports transient pain in his left arm after pushing off the floor, which he initially described as severe, ten out of ten, crushing pain, but later associated with a non-strenuous activity similar to yoga. No persistent chest pain, and he describes the arm pain as coincidental.  He currently takes losartan  after breakfast.         OBJECTIVE:       02/25/2023    1:42 PM  Depression screen PHQ 2/9  Decreased Interest 0  Down, Depressed, Hopeless 0  PHQ - 2 Score 0     BP Readings from Last 3 Encounters:  10/08/23 (!)  140/80  07/24/23 (!) 140/90  02/25/23 130/80    BP (!) 140/80   Pulse 72   Wt 206 lb 6.4 oz (93.6 kg)   SpO2 98%   BMI 25.80 kg/m    Physical Exam          Physical Exam  ASSESSMENT/PLAN:   Assessment & Plan Essential hypertension    Assessment and Plan    Hypertension Blood pressure elevated. Recent stressors increased levels despite previous downward trend with weight loss. Current treatment with low-dose losartan . Goal is 130/80 mmHg. Explained age-related vascular changes and long-term risks of uncontrolled hypertension. Discussed low risk of acute issues at current levels. - Increase losartan  to 100 mg daily. Option to take two 50 mg tablets. - Monitor blood pressure morning and evening for three weeks. - Follow up in three weeks to review readings and adjust treatment.         Atasha Colebank A. Vita MD [facility]

## 2023-10-20 ENCOUNTER — Other Ambulatory Visit: Payer: Self-pay

## 2023-10-20 DIAGNOSIS — Z860100 Personal history of colon polyps, unspecified: Secondary | ICD-10-CM

## 2023-10-20 MED ORDER — PEG 3350-KCL-NABCB-NACL-NASULF 236 G PO SOLR: 4000.0000 mL | Freq: Once | ORAL | 0 refills | Status: AC

## 2023-10-21 ENCOUNTER — Telehealth: Payer: Self-pay

## 2023-10-21 NOTE — Telephone Encounter (Signed)
 Per pt he was ask if he had any surgeries the last year . Pt states he forgot to tell you he had hernia surgery  on Aug 07, 2022 which was a year and 2 months ago per pt.

## 2023-10-29 ENCOUNTER — Ambulatory Visit (INDEPENDENT_AMBULATORY_CARE_PROVIDER_SITE_OTHER): Admitting: Family Medicine

## 2023-10-29 ENCOUNTER — Encounter: Payer: Self-pay | Admitting: Family Medicine

## 2023-10-29 VITALS — BP 138/84 | HR 67 | Wt 206.0 lb

## 2023-10-29 DIAGNOSIS — I10 Essential (primary) hypertension: Secondary | ICD-10-CM | POA: Diagnosis not present

## 2023-10-29 MED ORDER — LOSARTAN POTASSIUM 100 MG PO TABS: 100.0000 mg | ORAL_TABLET | Freq: Every day | ORAL | 0 refills | Status: DC

## 2023-10-29 MED ORDER — AMLODIPINE BESYLATE 10 MG PO TABS
10.0000 mg | ORAL_TABLET | Freq: Every day | ORAL | 1 refills | Status: AC
Start: 2023-10-29 — End: ?

## 2023-10-29 NOTE — Progress Notes (Signed)
   Name: Curtis Walker   Date of Visit: 10/29/23   Date of last visit with me: 10/08/2023   CHIEF COMPLAINT:  Chief Complaint  Patient presents with   Follow-up    2 week follow up. No change.        HPI:  Discussed the use of AI scribe software for clinical note transcription with the patient, who gave verbal consent to proceed.  History of Present Illness   Curtis Walker is a 68 year old male with hypertension who presents for blood pressure management.  He notes that his blood pressure has been elevated, which he partly attributes to anxiety. He is currently taking Cozaar  (losartan ) 100 mg daily and reports no stomach issues with this medication.  He has a colonoscopy scheduled for next month. He experiences insomnia, which he believes may be related to anxiety about his blood pressure readings. He is concerned that checking his blood pressure before bed may contribute to this issue.         OBJECTIVE:       02/25/2023    1:42 PM  Depression screen PHQ 2/9  Decreased Interest 0  Down, Depressed, Hopeless 0  PHQ - 2 Score 0     BP Readings from Last 3 Encounters:  10/29/23 138/84  10/08/23 (!) 140/80  07/24/23 (!) 140/90    BP 138/84   Pulse 67   Wt 205 lb 15.7 oz (93.4 kg)   SpO2 98%   BMI 25.75 kg/m    Physical Exam          Physical Exam Constitutional:      Appearance: Normal appearance.  Neurological:     General: No focal deficit present.     Mental Status: He is alert and oriented to person, place, and time. Mental status is at baseline.     ASSESSMENT/PLAN:   Assessment & Plan Essential hypertension    Assessment and Plan    Hypertension Hypertension likely due to anxiety and age-related vascular changes. Office blood pressure acceptable; age-adjusted control criteria applied. - Continue Cozaar  100 mg daily. - Add amlodipine . - Discussed potential lower leg swelling with amlodipine , resolves with exercise. - Emphasized  avoiding obsessive blood pressure monitoring to prevent anxiety-induced elevations. - Monitor blood pressure twice daily for one week, then periodically. - Follow up in three months. - Contact provider if home readings remain high (e.g., 150s).  General Health Maintenance Colonoscopy scheduled for routine health maintenance. - Proceed with scheduled colonoscopy next month.         Maynard David A. Vita MD [facility]

## 2023-11-24 ENCOUNTER — Encounter
Admission: RE | Disposition: A | Discharge status: Home / Self Care | Payer: Self-pay | Source: Home / Self Care | Attending: Gastroenterology

## 2023-11-24 ENCOUNTER — Ambulatory Visit: Admitting: Anesthesiology

## 2023-11-24 ENCOUNTER — Ambulatory Visit
Admission: RE | Admit: 2023-11-24 | Discharge: 2023-11-24 | Disposition: A | Discharge status: Home / Self Care | Attending: Gastroenterology | Admitting: Gastroenterology

## 2023-11-24 ENCOUNTER — Encounter: Payer: Self-pay | Admitting: Gastroenterology

## 2023-11-24 DIAGNOSIS — I129 Hypertensive chronic kidney disease with stage 1 through stage 4 chronic kidney disease, or unspecified chronic kidney disease: Secondary | ICD-10-CM | POA: Diagnosis not present

## 2023-11-24 DIAGNOSIS — K641 Second degree hemorrhoids: Secondary | ICD-10-CM | POA: Insufficient documentation

## 2023-11-24 DIAGNOSIS — D122 Benign neoplasm of ascending colon: Secondary | ICD-10-CM | POA: Insufficient documentation

## 2023-11-24 DIAGNOSIS — Z1211 Encounter for screening for malignant neoplasm of colon: Secondary | ICD-10-CM | POA: Insufficient documentation

## 2023-11-24 DIAGNOSIS — K5730 Diverticulosis of large intestine without perforation or abscess without bleeding: Secondary | ICD-10-CM | POA: Diagnosis not present

## 2023-11-24 DIAGNOSIS — D120 Benign neoplasm of cecum: Secondary | ICD-10-CM | POA: Insufficient documentation

## 2023-11-24 DIAGNOSIS — Z87891 Personal history of nicotine dependence: Secondary | ICD-10-CM | POA: Insufficient documentation

## 2023-11-24 DIAGNOSIS — Z860100 Personal history of colon polyps, unspecified: Secondary | ICD-10-CM | POA: Diagnosis not present

## 2023-11-24 DIAGNOSIS — Z79899 Other long term (current) drug therapy: Secondary | ICD-10-CM | POA: Diagnosis not present

## 2023-11-24 DIAGNOSIS — Z860101 Personal history of adenomatous and serrated colon polyps: Secondary | ICD-10-CM | POA: Diagnosis not present

## 2023-11-24 DIAGNOSIS — K635 Polyp of colon: Secondary | ICD-10-CM | POA: Diagnosis not present

## 2023-11-24 DIAGNOSIS — E785 Hyperlipidemia, unspecified: Secondary | ICD-10-CM | POA: Insufficient documentation

## 2023-11-24 DIAGNOSIS — I10 Essential (primary) hypertension: Secondary | ICD-10-CM | POA: Insufficient documentation

## 2023-11-24 DIAGNOSIS — N1831 Chronic kidney disease, stage 3a: Secondary | ICD-10-CM | POA: Diagnosis not present

## 2023-11-24 HISTORY — PX: COLONOSCOPY: SHX5424

## 2023-11-24 HISTORY — PX: POLYPECTOMY: SHX149

## 2023-11-24 SURGERY — COLONOSCOPY
Anesthesia: General

## 2023-11-24 MED ORDER — SODIUM CHLORIDE 0.9 % IV SOLN
INTRAVENOUS | Status: DC
  Administered 2023-11-24: 20 mL/h via INTRAVENOUS

## 2023-11-24 MED ORDER — PHENYLEPHRINE 80 MCG/ML (10ML) SYRINGE FOR IV PUSH (FOR BLOOD PRESSURE SUPPORT)
PREFILLED_SYRINGE | INTRAVENOUS | Status: DC | PRN
  Administered 2023-11-24: 40 ug via INTRAVENOUS

## 2023-11-24 MED ORDER — PHENYLEPHRINE 80 MCG/ML (10ML) SYRINGE FOR IV PUSH (FOR BLOOD PRESSURE SUPPORT)
PREFILLED_SYRINGE | INTRAVENOUS | Status: AC
  Filled 2023-11-24: qty 10

## 2023-11-24 MED ORDER — PROPOFOL 10 MG/ML IV BOLUS
INTRAVENOUS | Status: DC | PRN
  Administered 2023-11-24: 100 mg via INTRAVENOUS

## 2023-11-24 MED ORDER — PROPOFOL 500 MG/50ML IV EMUL
INTRAVENOUS | Status: DC | PRN
  Administered 2023-11-24: 150 ug/kg/min via INTRAVENOUS

## 2023-11-24 MED ORDER — PROPOFOL 1000 MG/100ML IV EMUL
INTRAVENOUS | Status: AC
  Filled 2023-11-24: qty 100

## 2023-11-24 NOTE — Transfer of Care (Signed)
 Immediate Anesthesia Transfer of Care Note  Patient: Curtis Walker  Procedure(s) Performed: COLONOSCOPY POLYPECTOMY, INTESTINE  Patient Location: PACU  Anesthesia Type:General  Level of Consciousness: awake and sedated  Airway & Oxygen Therapy: Patient Spontanous Breathing and Patient connected to nasal cannula oxygen  Post-op Assessment: Report given to RN and Post -op Vital signs reviewed and stable  Post vital signs: Reviewed and stable  Last Vitals:  Vitals Value Taken Time  BP    Temp    Pulse    Resp    SpO2      Last Pain:  Vitals:   11/24/23 0825  TempSrc: Temporal  PainSc: 0-No pain         Complications: There were no known notable events for this encounter.

## 2023-11-24 NOTE — Anesthesia Postprocedure Evaluation (Signed)
 Anesthesia Post Note  Patient: Curtis Walker  Procedure(s) Performed: COLONOSCOPY POLYPECTOMY, INTESTINE  Patient location during evaluation: Endoscopy Anesthesia Type: General Level of consciousness: awake and alert Pain management: pain level controlled Vital Signs Assessment: post-procedure vital signs reviewed and stable Respiratory status: spontaneous breathing, nonlabored ventilation, respiratory function stable and patient connected to nasal cannula oxygen Cardiovascular status: blood pressure returned to baseline and stable Postop Assessment: no apparent nausea or vomiting Anesthetic complications: no   There were no known notable events for this encounter.   Last Vitals:  Vitals:   11/24/23 0949 11/24/23 0956  BP: (!) 94/58 96/66  Pulse: 60 73  Resp: 12 (!) 21  Temp:    SpO2: 96% 99%    Last Pain:  Vitals:   11/24/23 0956  TempSrc:   PainSc: 0-No pain                 [name]

## 2023-11-24 NOTE — H&P (Signed)
 [name], MD [facility] [address] [place], [place] [address] [phone] Fax : [phone]  Primary Care Physician:  Joyce Norleen BROCKS, MD Primary Gastroenterologist:  Dr. Copping  Pre-Procedure History & Physical: HPI:  Curtis Walker is a 68 y.o. male is here for an colonoscopy.   Past Medical History:  Diagnosis Date   Allergy    RHINITIS   Dyslipidemia    History of colonic polyps     Past Surgical History:  Procedure Laterality Date   COLONOSCOPY  2006   Dr. Rollin   COLONOSCOPY WITH PROPOFOL  N/A 11/24/2018   Procedure: COLONOSCOPY WITH PROPOFOL ;  Surgeon: Copping Rogelia, MD;  Location: [facility] ENDOSCOPY;  Service: Endoscopy;  Laterality: N/A;   fracture repair right humerus     wisdom teeth extraction      Prior to Admission medications   Medication Sig Start Date End Date Taking? Authorizing Provider  amLODipine  (NORVASC ) 10 MG tablet Take 1 tablet (10 mg total) by mouth daily. 10/29/23  Yes Jha, Panav, MD  ibuprofen  (ADVIL ) 800 MG tablet Take 1 tablet (800 mg total) by mouth every 8 (eight) hours as needed. 08/07/22  Yes Lane Shope, MD  losartan  (COZAAR ) 100 MG tablet Take 1 tablet (100 mg total) by mouth daily. 10/29/23  Yes Jha, Panav, MD  Magnesium 250 MG TABS Take 250 mg by mouth.   Yes [provider]  Multiple Vitamin (MULTIVITAMIN WITH MINERALS) TABS tablet Take 1 tablet by mouth daily.   Yes [provider]  OMEPRAZOLE PO Take by mouth. Patient taking differently: Take by mouth as needed.   Yes [provider]  rosuvastatin  (CRESTOR ) 20 MG tablet Take 1 tablet (20 mg total) by mouth daily. 02/25/23  Yes Joyce Norleen BROCKS, MD  vitamin E 180 MG (400 UNITS) capsule Take 400 Units by mouth daily.   Yes [provider]  Zinc 50 MG TABS Take 50 mg by mouth.   Yes [provider]  sildenafil  (REVATIO ) 20 MG tablet Take 1 to 5 holes daily as needed for erectile dysfunction 12/15/19   Joyce Norleen BROCKS, MD     Allergies as of 10/20/2023 - Review Complete 10/08/2023  Allergen Reaction Noted   Codeine Itching 01/07/2011    Family History  Problem Relation Age of Onset   Asthma Mother    Mental illness Mother    Arthritis Father    Hypertension Father    Hypertension Paternal Grandfather    Heart disease Paternal Grandfather     Social History   Socioeconomic History   Marital status: Single    Spouse name: Not on file   Number of children: Not on file   Years of education: Not on file   Highest education level: Some college, no degree  Occupational History   Not on file  Tobacco Use   Smoking status: Former    Current packs/day: 0.00    Types: Cigarettes    Quit date: 01/17/1991    Years since quitting: 32.8    Passive exposure: Past   Smokeless tobacco: Never  Vaping Use   Vaping status: Never Used  Substance and Sexual Activity   Alcohol use: Yes    Comment: rare   Drug use: No   Sexual activity: Yes  Other Topics Concern   Not on file  Social History Narrative   Not on file   Social Drivers of Health   Financial Resource Strain: Low Risk  (02/20/2023)   Overall Physicist, medical Strain (  CARDIA)    Difficulty of Paying Living Expenses: Not hard at all  Food Insecurity: No Food Insecurity (02/20/2023)   Hunger Vital Sign    Worried About Running Out of Food in the Last Year: Never true    Ran Out of Food in the Last Year: Never true  Transportation Needs: No Transportation Needs (02/20/2023)   PRAPARE - Administrator, Civil Service (Medical): No    Lack of Transportation (Non-Medical): No  Physical Activity: Sufficiently Active (02/20/2023)   Exercise Vital Sign    Days of Exercise per Week: 5 days    Minutes of Exercise per Session: 80 min  Stress: No Stress Concern Present (02/20/2023)   [employer] of Occupational Health - Occupational Stress Questionnaire    Feeling of Stress : Not at all  Social Connections: Moderately Isolated (02/20/2023)    Social Connection and Isolation Panel    Frequency of Communication with Friends and Family: Three times a week    Frequency of Social Gatherings with Friends and Family: Once a week    Attends Religious Services: Never    Database administrator or Organizations: No    Attends Engineer, structural: Not on file    Marital Status: Living with partner  Intimate Partner Violence: Not on file    Review of Systems: See HPI, otherwise negative ROS  Physical Exam: BP (!) 140/80   Pulse 73   Temp (!) 96.4 F (35.8 C) (Temporal)   Resp 20   Ht 6' 3 (1.905 m)   Wt 92.6 kg   SpO2 100%   BMI 25.52 kg/m  General:   Alert,  pleasant and cooperative in NAD Head:  Normocephalic and atraumatic. Neck:  Supple; no masses or thyromegaly. Lungs:  Clear throughout to auscultation.    Heart:  Regular rate and rhythm. Abdomen:  Soft, nontender and nondistended. Normal bowel sounds, without guarding, and without rebound.   Neurologic:  Alert and  oriented x4;  grossly normal neurologically.  Impression/Plan: Curtis Walker is here for an colonoscopy to be performed for a history of adenomatous polyps on 2020   Risks, benefits, limitations, and alternatives regarding  colonoscopy have been reviewed with the patient.  Questions have been answered.  All parties agreeable.   [name], MD  11/24/2023, 8:39 AM

## 2023-11-24 NOTE — Anesthesia Preprocedure Evaluation (Signed)
 Anesthesia Evaluation  Patient identified by MRN, date of birth, ID band Patient awake    Reviewed: Allergy & Precautions, NPO status , Patient's Chart, lab work & pertinent test results  History of Anesthesia Complications Negative for: history of anesthetic complications  Airway Mallampati: III  TM Distance: >3 FB Neck ROM: full    Dental no notable dental hx.    Pulmonary neg pulmonary ROS, former smoker   Pulmonary exam normal        Cardiovascular hypertension, On Medications Normal cardiovascular exam     Neuro/Psych negative neurological ROS  negative psych ROS   GI/Hepatic negative GI ROS, Neg liver ROS,,,  Endo/Other  negative endocrine ROS    Renal/GU CRFRenal disease  negative genitourinary   Musculoskeletal   Abdominal   Peds  Hematology negative hematology ROS (+)   Anesthesia Other Findings Past Medical History: No date: Allergy     Comment:  RHINITIS No date: Dyslipidemia No date: History of colonic polyps  Past Surgical History: 2006: COLONOSCOPY     Comment:  Dr. Rollin 11/24/2018: COLONOSCOPY WITH PROPOFOL ; N/A     Comment:  Procedure: COLONOSCOPY WITH PROPOFOL ;  Surgeon: Jinny Carmine, MD;  Location: [facility] ENDOSCOPY;  Service:               Endoscopy;  Laterality: N/A; No date: fracture repair right humerus No date: wisdom teeth extraction     Reproductive/Obstetrics negative OB ROS                              Anesthesia Physical Anesthesia Plan  ASA: 3  Anesthesia Plan: General   Post-op Pain Management: Minimal or no pain anticipated   Induction: Intravenous  PONV Risk Score and Plan: 1 and Propofol  infusion and TIVA  Airway Management Planned: Natural Airway and Nasal Cannula  Additional Equipment:   Intra-op Plan:   Post-operative Plan:   Informed Consent: I have reviewed the patients History and Physical, chart, labs and  discussed the procedure including the risks, benefits and alternatives for the proposed anesthesia with the patient or authorized representative who has indicated his/her understanding and acceptance.     Dental Advisory Given  Plan Discussed with: Anesthesiologist, CRNA and Surgeon  Anesthesia Plan Comments: (Patient consented for risks of anesthesia including but not limited to:  - adverse reactions to medications - risk of airway placement if required - damage to eyes, teeth, lips or other oral mucosa - nerve damage due to positioning  - sore throat or hoarseness - Damage to heart, brain, nerves, lungs, other parts of body or loss of life  Patient voiced understanding and assent.)        Anesthesia Quick Evaluation

## 2023-11-24 NOTE — Op Note (Signed)
 [facility] Gastroenterology Patient Name: Curtis Walker Procedure Date: 11/24/2023 9:01 AM MRN: [id number] Account #: [id number] Date of Birth: 12-07-1955 Admit Type: Outpatient Age: 68 Room: [facility] ENDO ROOM 4 Gender: Male Note Status: Finalized Instrument Name: Peds Colonoscope [id number] Procedure:             Colonoscopy Indications:           High risk colon cancer surveillance: Personal history                         of colonic polyps Providers:             [name] MD, MD Referring MD:          Norleen KYM Jobs, MD (Referring MD) Medicines:             Propofol  per Anesthesia Complications:         No immediate complications. Procedure:             Pre-Anesthesia Assessment:                        - Prior to the procedure, a History and Physical was                         performed, and patient medications and allergies were                         reviewed. The patient's tolerance of previous                         anesthesia was also reviewed. The risks and benefits                         of the procedure and the sedation options and risks                         were discussed with the patient. All questions were                         answered, and informed consent was obtained. Prior                         Anticoagulants: The patient has taken no anticoagulant                         or antiplatelet agents. ASA Grade Assessment: II - A                         patient with mild systemic disease. After reviewing                         the risks and benefits, the patient was deemed in                         satisfactory condition to undergo the procedure.                        After obtaining informed consent, the colonoscope was  passed under direct vision. Throughout the procedure,                         the patient's blood pressure, pulse, and oxygen                         saturations were monitored continuously. The                          Colonoscope was introduced through the anus and                         advanced to the the cecum, identified by appendiceal                         orifice and ileocecal valve. The colonoscopy was                         performed without difficulty. The patient tolerated                         the procedure well. The quality of the bowel                         preparation was excellent. Findings:      The perianal and digital rectal examinations were normal.      A 2 mm polyp was found in the cecum. The polyp was sessile. The polyp       was removed with a cold biopsy forceps. Resection and retrieval were       complete.      Three sessile polyps were found in the ascending colon. The polyps were       2 to 4 mm in size. These polyps were removed with a cold snare.       Resection and retrieval were complete.      Multiple small-mouthed diverticula were found in the sigmoid colon.      Non-bleeding internal hemorrhoids were found during retroflexion. The       hemorrhoids were Grade II (internal hemorrhoids that prolapse but reduce       spontaneously). Impression:            - One 2 mm polyp in the cecum, removed with a cold                         biopsy forceps. Resected and retrieved.                        - Three 2 to 4 mm polyps in the ascending colon,                         removed with a cold snare. Resected and retrieved.                        - Diverticulosis in the sigmoid colon.                        - Non-bleeding internal hemorrhoids. Recommendation:        - Discharge patient to home.                        -  Resume previous diet.                        - Continue present medications.                        - Await pathology results.                        - Repeat colonoscopy in 5 years for surveillance. Procedure Code(s):     --- Professional ---                        [phone], Colonoscopy, flexible; with removal of                          tumor(s), polyp(s), or other lesion(s) by snare                         technique                        [id number], 59, Colonoscopy, flexible; with biopsy, single                         or multiple Diagnosis Code(s):     --- Professional ---                        Z86.010, Personal history of colonic polyps                        D12.2, Benign neoplasm of ascending colon CPT copyright 2022 American Medical Association. All rights reserved. The codes documented in this report are preliminary and upon coder review may  be revised to meet current compliance requirements. [name] MD, MD 11/24/2023 9:44:16 AM This report has been signed electronically. Number of Addenda: 0 Note Initiated On: 11/24/2023 9:01 AM Scope Withdrawal Time: 0 hours 8 minutes 10 seconds  Total Procedure Duration: 0 hours 15 minutes 29 seconds  Estimated Blood Loss:  Estimated blood loss: none.      [facility]

## 2023-11-25 ENCOUNTER — Ambulatory Visit: Payer: Self-pay | Admitting: Gastroenterology

## 2023-11-25 DIAGNOSIS — Z860101 Personal history of adenomatous and serrated colon polyps: Secondary | ICD-10-CM | POA: Insufficient documentation

## 2023-11-25 LAB — SURGICAL PATHOLOGY

## 2023-12-31 ENCOUNTER — Encounter: Payer: Self-pay | Admitting: Family Medicine

## 2024-01-29 ENCOUNTER — Encounter: Payer: Self-pay | Admitting: Family Medicine

## 2024-01-29 ENCOUNTER — Ambulatory Visit: Payer: Self-pay | Admitting: Family Medicine

## 2024-01-29 VITALS — BP 130/76 | HR 93 | Wt 212.2 lb

## 2024-01-29 DIAGNOSIS — I10 Essential (primary) hypertension: Secondary | ICD-10-CM

## 2024-01-29 NOTE — Progress Notes (Signed)
° °  Name: Curtis Walker   Date of Visit: 01/29/2024   Date of last visit with me: 10/29/2023   CHIEF COMPLAINT:  Chief Complaint  Patient presents with   Acute Visit    3 month follow up on bp        HPI:  Discussed the use of AI scribe software for clinical note transcription with the patient, who gave verbal consent to proceed.  History of Present Illness   Curtis Walker is a 68 year old male with hypertension who presents for a follow-up visit.  Since starting his new medication, he has not experienced any episodes of feeling 'swimmy' or lightheaded at night, which he used to experience when getting up to go to the bathroom. He has not had any instances where his diastolic blood pressure reached 90 mmHg, which was a concern previously.  He occasionally forgets to take his medication, but overall, his blood pressure has been stable. The highest reading he recalls was on the morning of a scheduled tooth extraction, attributing the increase to nerves.  He is currently taking a medication that he refers to as 'Vaseline or whatever it's called.'         OBJECTIVE:       02/25/2023    1:42 PM  Depression screen PHQ 2/9  Decreased Interest 0  Down, Depressed, Hopeless 0  PHQ - 2 Score 0     BP Readings from Last 3 Encounters:  01/29/24 130/76  11/24/23 122/74  10/29/23 138/84    BP 130/76   Pulse 93   Wt 212 lb 3.2 oz (96.3 kg)   SpO2 98%   BMI 26.52 kg/m    Physical Exam          Physical Exam Constitutional:      Appearance: Normal appearance.  Neurological:     General: No focal deficit present.     Mental Status: He is alert and oriented to person, place, and time. Mental status is at baseline.     ASSESSMENT/PLAN:   Assessment & Plan Essential hypertension    Assessment and Plan    Essential hypertension Blood pressure well-controlled with current medication. - Continue current antihypertensive regimen. - Advised to monitor blood  pressure as needed. - Scheduled follow-up in a couple of weeks.         Leoma Folds A. Vita MD [facility]

## 2024-02-17 ENCOUNTER — Encounter: Payer: Self-pay | Admitting: Family Medicine

## 2024-02-19 ENCOUNTER — Other Ambulatory Visit: Payer: Self-pay | Admitting: Family Medicine

## 2024-02-19 DIAGNOSIS — E785 Hyperlipidemia, unspecified: Secondary | ICD-10-CM

## 2024-03-02 ENCOUNTER — Ambulatory Visit: Payer: PPO | Admitting: Family Medicine

## 2024-03-02 ENCOUNTER — Encounter: Payer: Self-pay | Admitting: Family Medicine

## 2024-03-02 VITALS — BP 138/78 | HR 75 | Ht 75.0 in | Wt 213.0 lb

## 2024-03-02 DIAGNOSIS — E785 Hyperlipidemia, unspecified: Secondary | ICD-10-CM

## 2024-03-02 DIAGNOSIS — Z0000 Encounter for general adult medical examination without abnormal findings: Secondary | ICD-10-CM | POA: Diagnosis not present

## 2024-03-02 DIAGNOSIS — N1831 Chronic kidney disease, stage 3a: Secondary | ICD-10-CM

## 2024-03-02 DIAGNOSIS — I10 Essential (primary) hypertension: Secondary | ICD-10-CM

## 2024-03-02 LAB — LIPID PANEL

## 2024-03-02 MED ORDER — LOSARTAN POTASSIUM 100 MG PO TABS: 100.0000 mg | ORAL_TABLET | Freq: Every day | ORAL | 1 refills | Status: AC

## 2024-03-02 MED ORDER — ROSUVASTATIN CALCIUM 20 MG PO TABS: 20.0000 mg | ORAL_TABLET | Freq: Every day | ORAL | 1 refills | Status: AC

## 2024-03-02 NOTE — Progress Notes (Signed)
 "  Name: Curtis Walker   Date of Visit: 03/02/24   Date of last visit with me: 01/29/2024   CHIEF COMPLAINT:  Chief Complaint  Patient presents with   Medicare Wellness    Medicare awv.        HPI:  Discussed the use of AI scribe software for clinical note transcription with the patient, who gave verbal consent to proceed.  History of Present Illness   Curtis Walker is a 68 year old male with hypertension who presents with swelling and neuropathy in his feet.  He experiences intermittent swelling in his feet, more pronounced on the right side, which he describes as 'almost like an allergic reaction.' The swelling has been occurring on and off since Christmas and is most noticeable when he is driving due to prolonged sitting. The swelling exacerbates his neuropathy symptoms, describing it as 'turning into volume on the neuropathy.'  He takes Aleve on bad days, which he finds helpful. He is currently on amlodipine  for blood pressure management. He does not wear compression socks. He mentions that the swelling sometimes feels like it starts suddenly, and he can 'almost feel it' beginning.  He notes joint swelling in his hands after eating shrimp, suggesting a possible food-related reaction.  In terms of physical activity, he walks his 80-pound dog regularly as he does not have a fenced yard. He has a weight machine in his basement but finds it too cold to use during the winter months. He also practices strenuous yoga once a week.         OBJECTIVE:       03/02/2024    2:11 PM  Depression screen PHQ 2/9  Decreased Interest 0  Down, Depressed, Hopeless 0  PHQ - 2 Score 0     BP Readings from Last 3 Encounters:  03/02/24 138/78  01/29/24 130/76  11/24/23 122/74    BP 138/78   Pulse 75   Ht 6' 3 (1.905 m)   Wt 213 lb (96.6 kg)   SpO2 98%   BMI 26.62 kg/m    Physical Exam          Physical Exam Constitutional:      Appearance: Normal appearance.   Neurological:     General: No focal deficit present.     Mental Status: He is alert and oriented to person, place, and time. Mental status is at baseline.     ASSESSMENT/PLAN:   Assessment & Plan Encounter for Medicare annual wellness exam  Essential hypertension  Stage 3a chronic kidney disease (HCC)  Hyperlipidemia LDL goal <100    Assessment and Plan    Essential hypertension Blood pressure controlled with current medication. Peripheral edema monitored as a side effect of amlodipine . - Continue current antihypertensive regimen. - Monitor blood pressure and peripheral edema. - Consider switching to hydrochlorothiazide if edema becomes problematic. - CBC, CMP and Lipid panel pending  HLD -Lipid panel pending   Peripheral edema due to antihypertensive medication Intermittent edema, especially in the right leg, likely due to amlodipine . Exacerbated by prolonged sitting, possibly due to venous return issues. - Wear compression socks during prolonged sitting or driving. - Monitor edema symptoms and report if more frequent or severe. - Consider medication change if edema persists despite conservative measures.  Peripheral neuropathy Symptoms exacerbated by peripheral edema, improve with movement. - Encouraged regular physical activity to manage neuropathy symptoms.  General health maintenance Discussed exercise regimen and benefits of regular physical activity. - Encouraged regular  exercise, including weight training and yoga, at least twice a week. - Performed basic labs including blood work and electrolytes.    -Comprehensive annual physical exam completed today. Reviewed interval history, current medical issues, medications, allergies, and preventive care needs. Addressed all patient questions and concerns. Discussed lifestyle factors including diet, exercise, sleep, and stress management. Reviewed recommended age-appropriate screenings, labs, and vaccinations. Counseling  provided on healthy habits and routine health maintenance. Follow-up as indicated based on findings and results.\     Cosette Prindle A. Vita MD [facility] "

## 2024-03-02 NOTE — Progress Notes (Signed)
 "  Chief Complaint  Patient presents with   Medicare Wellness    Medicare awv.      Subjective:   Curtis Walker is a 69 y.o. male who presents for a Medicare Annual Wellness Visit.  Visit info / Clinical Intake: Medicare Wellness Visit Type:: Subsequent Annual Wellness Visit Persons participating in visit and providing information:: patient Medicare Wellness Visit Mode:: In-person (required for WTM) Interpreter Needed?: No Pre-visit prep was completed: no AWV questionnaire completed by patient prior to visit?: no Living arrangements:: lives with spouse/significant other Patient's Overall Health Status Rating: good Typical amount of pain: none Does pain affect daily life?: no Are you currently prescribed opioids?: no  Dietary Habits and Nutritional Risks How many meals a day?: 3 Eats fruit and vegetables daily?: yes Most meals are obtained by: preparing own meals In the last 2 weeks, have you had any of the following?: none Diabetic:: no  Functional Status Activities of Daily Living (to include ambulation/medication): Independent Ambulation: Independent Medication Administration: Independent Home Management (perform basic housework or laundry): Independent Manage your own finances?: yes Primary transportation is: driving Concerns about vision?: no *vision screening is required for WTM* Concerns about hearing?: no  Fall Screening Falls in the past year?: 0 Number of falls in past year: 0 Was there an injury with Fall?: 0 Fall Risk Category Calculator: 0 Patient Fall Risk Level: Low Fall Risk  Fall Risk Patient at Risk for Falls Due to: No Fall Risks Fall risk Follow up: Falls evaluation completed  Home and Transportation Safety: All rugs have non-skid backing?: yes All stairs or steps have railings?: yes Grab bars in the bathtub or shower?: (!) no Have non-skid surface in bathtub or shower?: (!) no Good home lighting?: yes Regular seat belt use?:  yes Hospital stays in the last year:: no  Cognitive Assessment Difficulty concentrating, remembering, or making decisions? : no Will 6CIT or Mini Cog be Completed: yes What year is it?: 0 points What month is it?: 0 points Give patient an address phrase to remember (5 components): [address] About what time is it?: 0 points Count backwards from 20 to 1: 0 points Say the months of the year in reverse: 0 points Repeat the address phrase from earlier: 0 points 6 CIT Score: 0 points  Advance Directives (For Healthcare) Does Patient Have a Medical Advance Directive?: Yes Does patient want to make changes to medical advance directive?: No - Patient declined Type of Advance Directive: [name]; Living will Copy of Healthcare Power of Attorney in Chart?: Yes - validated most recent copy scanned in chart (See row information) Copy of Living Will in Chart?: Yes - validated most recent copy scanned in chart (See row information)  Reviewed/Updated  Reviewed/Updated: Reviewed All (Medical, Surgical, Family, Medications, Allergies, Care Teams, Patient Goals)    Allergies (verified) Codeine   Current Medications (verified) Outpatient Encounter Medications as of 03/02/2024  Medication Sig   amLODipine  (NORVASC ) 10 MG tablet Take 1 tablet (10 mg total) by mouth daily.   ibuprofen  (ADVIL ) 800 MG tablet Take 1 tablet (800 mg total) by mouth every 8 (eight) hours as needed.   Magnesium 250 MG TABS Take 250 mg by mouth.   Multiple Vitamin (MULTIVITAMIN WITH MINERALS) TABS tablet Take 1 tablet by mouth daily.   OMEPRAZOLE PO Take by mouth. (Patient taking differently: Take by mouth as needed.)   sildenafil  (REVATIO ) 20 MG tablet Take 1 to 5 holes daily as needed for erectile dysfunction  vitamin E 180 MG (400 UNITS) capsule Take 400 Units by mouth daily.   Zinc 50 MG TABS Take 50 mg by mouth.   [DISCONTINUED] losartan  (COZAAR ) 100 MG tablet Take 1 tablet (100 mg total) by  mouth daily.   [DISCONTINUED] rosuvastatin  (CRESTOR ) 20 MG tablet TAKE 1 TABLET BY MOUTH DAILY   losartan  (COZAAR ) 100 MG tablet Take 1 tablet (100 mg total) by mouth daily.   rosuvastatin  (CRESTOR ) 20 MG tablet Take 1 tablet (20 mg total) by mouth daily.   No facility-administered encounter medications on file as of 03/02/2024.    History: Past Medical History:  Diagnosis Date   Allergy    RHINITIS   Dyslipidemia    History of colonic polyps    Past Surgical History:  Procedure Laterality Date   COLONOSCOPY  2006   Dr. Rollin   COLONOSCOPY N/A 11/24/2023   Procedure: COLONOSCOPY;  Surgeon: Jinny Carmine, MD;  Location: [facility] ENDOSCOPY;  Service: Endoscopy;  Laterality: N/A;   COLONOSCOPY WITH PROPOFOL  N/A 11/24/2018   Procedure: COLONOSCOPY WITH PROPOFOL ;  Surgeon: Jinny Carmine, MD;  Location: [facility] ENDOSCOPY;  Service: Endoscopy;  Laterality: N/A;   fracture repair right humerus     POLYPECTOMY  11/24/2023   Procedure: POLYPECTOMY, INTESTINE;  Surgeon: Jinny Carmine, MD;  Location: [facility] ENDOSCOPY;  Service: Endoscopy;;   wisdom teeth extraction     Family History  Problem Relation Age of Onset   Asthma Mother    Mental illness Mother    Arthritis Father    Hypertension Father    Hypertension Paternal Grandfather    Heart disease Paternal Grandfather    Social History   Occupational History   Not on file  Tobacco Use   Smoking status: Former    Current packs/day: 0.00    Types: Cigarettes    Quit date: 01/17/1991    Years since quitting: 33.1    Passive exposure: Past   Smokeless tobacco: Never  Vaping Use   Vaping status: Never Used  Substance and Sexual Activity   Alcohol use: Yes    Comment: rare   Drug use: No   Sexual activity: Yes   Tobacco Counseling Counseling given: Not Answered  SDOH Screenings   Food Insecurity: No Food Insecurity (02/20/2023)  Housing: Unknown (02/20/2023)  Transportation Needs: No Transportation Needs (02/20/2023)  Alcohol Screen: Low  Risk (02/20/2023)  Depression (PHQ2-9): Low Risk (03/02/2024)  Financial Resource Strain: Low Risk (02/20/2023)  Physical Activity: Sufficiently Active (02/20/2023)  Social Connections: Moderately Isolated (02/20/2023)  Stress: No Stress Concern Present (02/20/2023)  Tobacco Use: Medium Risk (03/02/2024)   See flowsheets for full screening details  Depression Screen PHQ 2 & 9 Depression Scale- Over the past 2 weeks, how often have you been bothered by any of the following problems? Little interest or pleasure in doing things: 0 Feeling down, depressed, or hopeless (PHQ Adolescent also includes...irritable): 0 PHQ-2 Total Score: 0     Goals Addressed             This Visit's Progress    Patient Stated       Lose weight             Objective:    Today's Vitals   03/02/24 1412  BP: 138/78  Pulse: 75  SpO2: 98%  Weight: 213 lb (96.6 kg)  Height: 6' 3 (1.905 m)   Body mass index is 26.62 kg/m.  Hearing/Vision screen No results found. Immunizations and Health Maintenance Health Maintenance  Topic Date Due  COVID-19 Vaccine (9 - 2025-26 season) 05/10/2024   Medicare Annual Wellness (AWV)  03/02/2025   Colonoscopy  11/23/2028   DTaP/Tdap/Td (4 - Td or Tdap) 03/02/2031   Pneumococcal Vaccine: 50+ Years  Completed   Influenza Vaccine  Completed   Hepatitis C Screening  Completed   Zoster Vaccines- Shingrix   Completed   Meningococcal B Vaccine  Aged Out        Assessment/Plan:  This is a routine wellness examination for Curtis Walker.  Patient Care Team: Takyah Ciaramitaro, MD as PCP - General (Family Medicine)  I have personally reviewed and noted the following in the patients chart:   Medical and social history Use of alcohol, tobacco or illicit drugs  Current medications and supplements including opioid prescriptions. Functional ability and status Nutritional status Physical activity Advanced directives List of other physicians Hospitalizations, surgeries, and ER visits  in previous 12 months Vitals Screenings to include cognitive, depression, and falls Referrals and appointments  Orders Placed This Encounter  Procedures   Comprehensive metabolic panel   CBC with Differential   Lipid Panel   >=15 minutes spent in face-to-face counseling focused on reducing cardiovascular risk.  Topics reviewed: - Assessment of individual CVD risk factors (BP, lipids, glucose, weight, activity level) - Lifestyle modifications including heart-healthy diet, sodium reduction, and increased physical activity - Smoking cessation and alcohol moderation when applicable - Importance of blood pressure control, lipid optimization, and medication adherence - Personalized risk-reduction plan discussed and agreed upon  In addition, I have reviewed and discussed with patient certain preventive protocols, quality metrics, and best practice recommendations. A written personalized care plan for preventive services as well as general preventive health recommendations were provided to patient.   Curtis DELENA Bustle, MD   03/02/2024   No follow-ups on file.  After Visit Summary: (In Person-Printed) AVS printed and given to the patient  Nurse Notes: none "

## 2024-03-03 ENCOUNTER — Ambulatory Visit: Payer: Self-pay | Admitting: Family Medicine

## 2024-03-03 LAB — CBC WITH DIFFERENTIAL/PLATELET
Basophils Absolute: 0.1 x10E3/uL (ref 0.0–0.2)
Basos: 1 %
EOS (ABSOLUTE): 0.1 x10E3/uL (ref 0.0–0.4)
Eos: 1 %
Hematocrit: 43 % (ref 37.5–51.0)
Hemoglobin: 14.1 g/dL (ref 13.0–17.7)
Immature Grans (Abs): 0 x10E3/uL (ref 0.0–0.1)
Immature Granulocytes: 0 %
Lymphocytes Absolute: 2.1 x10E3/uL (ref 0.7–3.1)
Lymphs: 24 %
MCH: 30.7 pg (ref 26.6–33.0)
MCHC: 32.8 g/dL (ref 31.5–35.7)
MCV: 94 fL (ref 79–97)
Monocytes Absolute: 0.4 x10E3/uL (ref 0.1–0.9)
Monocytes: 5 %
Neutrophils Absolute: 6 x10E3/uL (ref 1.4–7.0)
Neutrophils: 69 %
Platelets: 271 x10E3/uL (ref 150–450)
RBC: 4.6 x10E6/uL (ref 4.14–5.80)
RDW: 12.2 % (ref 11.6–15.4)
WBC: 8.6 x10E3/uL (ref 3.4–10.8)

## 2024-03-03 LAB — COMPREHENSIVE METABOLIC PANEL WITH GFR
ALT: 39 IU/L (ref 0–44)
AST: 35 IU/L (ref 0–40)
Albumin: 4.9 g/dL (ref 3.9–4.9)
Alkaline Phosphatase: 84 IU/L (ref 47–123)
BUN/Creatinine Ratio: 18 (ref 10–24)
BUN: 22 mg/dL (ref 8–27)
Bilirubin Total: 0.6 mg/dL (ref 0.0–1.2)
CO2: 22 mmol/L (ref 20–29)
Calcium: 9.7 mg/dL (ref 8.6–10.2)
Chloride: 103 mmol/L (ref 96–106)
Creatinine, Ser: 1.25 mg/dL (ref 0.76–1.27)
Globulin, Total: 2.4 g/dL (ref 1.5–4.5)
Glucose: 101 mg/dL — AB (ref 70–99)
Potassium: 4.1 mmol/L (ref 3.5–5.2)
Sodium: 143 mmol/L (ref 134–144)
Total Protein: 7.3 g/dL (ref 6.0–8.5)
eGFR: 62 mL/min/1.73

## 2024-03-03 LAB — LIPID PANEL
Cholesterol, Total: 125 mg/dL (ref 100–199)
HDL: 42 mg/dL
LDL CALC COMMENT:: 3 ratio (ref 0.0–5.0)
LDL Chol Calc (NIH): 64 mg/dL (ref 0–99)
Triglycerides: 100 mg/dL (ref 0–149)
VLDL Cholesterol Cal: 19 mg/dL (ref 5–40)

## 2025-03-03 ENCOUNTER — Encounter: Admitting: Family Medicine
